# Patient Record
Sex: Male | Born: 1985 | Race: White | Hispanic: No | Marital: Single | Smoking: Current every day smoker
Health system: Southern US, Community
[De-identification: ages and names within clinical notes are randomized; demographics above are authoritative.]

## PROBLEM LIST (undated history)

## (undated) DIAGNOSIS — F419 Anxiety disorder, unspecified: Secondary | ICD-10-CM

## (undated) DIAGNOSIS — J189 Pneumonia, unspecified organism: Secondary | ICD-10-CM

## (undated) DIAGNOSIS — K219 Gastro-esophageal reflux disease without esophagitis: Secondary | ICD-10-CM

## (undated) HISTORY — PX: NO PAST SURGERIES: SHX2092

---

## 2008-07-07 ENCOUNTER — Emergency Department: Payer: Self-pay | Admitting: Emergency Medicine

## 2009-06-06 ENCOUNTER — Emergency Department: Payer: Self-pay | Admitting: Emergency Medicine

## 2010-01-13 ENCOUNTER — Emergency Department: Payer: Self-pay | Admitting: Internal Medicine

## 2010-11-04 ENCOUNTER — Emergency Department: Payer: Self-pay | Admitting: Emergency Medicine

## 2010-12-25 ENCOUNTER — Emergency Department: Payer: Self-pay | Admitting: Unknown Physician Specialty

## 2012-12-30 ENCOUNTER — Emergency Department: Payer: Self-pay | Admitting: Emergency Medicine

## 2012-12-30 LAB — COMPREHENSIVE METABOLIC PANEL
Albumin: 4.6 g/dL (ref 3.4–5.0)
Alkaline Phosphatase: 119 U/L (ref 50–136)
Anion Gap: 12 (ref 7–16)
BUN: 7 mg/dL (ref 7–18)
Bilirubin,Total: 0.3 mg/dL (ref 0.2–1.0)
Calcium, Total: 8.8 mg/dL (ref 8.5–10.1)
Chloride: 107 mmol/L (ref 98–107)
Co2: 25 mmol/L (ref 21–32)
Creatinine: 0.84 mg/dL (ref 0.60–1.30)
EGFR (African American): >60
EGFR (Non-African Amer.): >60
Osmolality: 285 (ref 275–301)
Potassium: 4.5 mmol/L (ref 3.5–5.1)
SGOT(AST): 47 U/L — ABNORMAL HIGH (ref 15–37)
SGPT (ALT): 64 U/L (ref 12–78)
Sodium: 144 mmol/L (ref 136–145)
Total Protein: 8.4 g/dL — ABNORMAL HIGH (ref 6.4–8.2)

## 2012-12-30 LAB — CBC
HCT: 48.4 % (ref 40.0–52.0)
MCH: 31.9 pg (ref 26.0–34.0)
MCHC: 34.5 g/dL (ref 32.0–36.0)
MCV: 93 fL (ref 80–100)
RBC: 5.23 10*6/uL (ref 4.40–5.90)
RDW: 13.5 % (ref 11.5–14.5)
WBC: 9.6 10*3/uL (ref 3.8–10.6)

## 2012-12-30 LAB — DRUG SCREEN, URINE
Amphetamines, Ur Screen: NEGATIVE (ref ?–1000)
Benzodiazepine, Ur Scrn: NEGATIVE (ref ?–200)
MDMA (Ecstasy)Ur Screen: NEGATIVE (ref ?–500)
Methadone, Ur Screen: NEGATIVE (ref ?–300)
Opiate, Ur Screen: NEGATIVE (ref ?–300)
Tricyclic, Ur Screen: NEGATIVE (ref ?–1000)

## 2012-12-30 LAB — URINALYSIS, COMPLETE
Bacteria: NONE SEEN
Glucose,UR: NEGATIVE mg/dL (ref 0–75)
Ketone: NEGATIVE
Leukocyte Esterase: NEGATIVE
Nitrite: NEGATIVE
Ph: 6 (ref 4.5–8.0)
Protein: NEGATIVE
RBC,UR: NONE SEEN /HPF (ref 0–5)
Specific Gravity: 1.006 (ref 1.003–1.030)
WBC UR: NONE SEEN /HPF (ref 0–5)

## 2012-12-30 LAB — TSH: Thyroid Stimulating Horm: 2.12 u[IU]/mL

## 2012-12-30 LAB — ETHANOL
Ethanol %: 0.244 % — ABNORMAL HIGH (ref 0.000–0.080)
Ethanol: 244 mg/dL

## 2013-05-08 ENCOUNTER — Emergency Department: Payer: Self-pay | Admitting: Emergency Medicine

## 2014-03-12 ENCOUNTER — Emergency Department: Payer: Self-pay | Admitting: Emergency Medicine

## 2014-03-12 LAB — BASIC METABOLIC PANEL WITH GFR
Anion Gap: 4 — ABNORMAL LOW
BUN: 15 mg/dL
Calcium, Total: 8.8 mg/dL
Chloride: 104 mmol/L
Co2: 28 mmol/L
Creatinine: 0.82 mg/dL
EGFR (African American): >60
EGFR (Non-African Amer.): >60
Glucose: 94 mg/dL
Osmolality: 273
Potassium: 3.9 mmol/L
Sodium: 136 mmol/L

## 2014-03-12 LAB — CBC
HCT: 43.9 % (ref 40.0–52.0)
HGB: 14.6 g/dL (ref 13.0–18.0)
MCH: 30.3 pg (ref 26.0–34.0)
MCHC: 33.3 g/dL (ref 32.0–36.0)
MCV: 91 fL (ref 80–100)
Platelet: 234 10*3/uL (ref 150–440)
RBC: 4.83 10*6/uL (ref 4.40–5.90)
RDW: 13.6 % (ref 11.5–14.5)
WBC: 7.3 10*3/uL (ref 3.8–10.6)

## 2014-03-12 LAB — TROPONIN I: Troponin-I: <0.02 ng/mL

## 2014-07-18 ENCOUNTER — Emergency Department: Payer: Self-pay | Admitting: Emergency Medicine

## 2014-07-18 LAB — CBC
HCT: 45.6 % (ref 40.0–52.0)
HGB: 15.4 g/dL (ref 13.0–18.0)
MCH: 32 pg (ref 26.0–34.0)
MCHC: 33.7 g/dL (ref 32.0–36.0)
MCV: 95 fL (ref 80–100)
Platelet: 232 10*3/uL (ref 150–440)
RBC: 4.81 10*6/uL (ref 4.40–5.90)
RDW: 13 % (ref 11.5–14.5)
WBC: 7.3 10*3/uL (ref 3.8–10.6)

## 2014-07-18 LAB — BASIC METABOLIC PANEL
Anion Gap: 11 (ref 7–16)
BUN: 10 mg/dL (ref 7–18)
Calcium, Total: 8.7 mg/dL (ref 8.5–10.1)
Chloride: 109 mmol/L — ABNORMAL HIGH (ref 98–107)
Co2: 23 mmol/L (ref 21–32)
Creatinine: 0.87 mg/dL (ref 0.60–1.30)
EGFR (Non-African Amer.): >60
Glucose: 91 mg/dL (ref 65–99)
Osmolality: 284 (ref 275–301)
POTASSIUM: 4.2 mmol/L (ref 3.5–5.1)
Sodium: 143 mmol/L (ref 136–145)

## 2014-07-18 LAB — TROPONIN I

## 2015-03-25 NOTE — Consult Note (Signed)
Brief Consult Note: Diagnosis: Alcohol Dependence, Mood Do NOS.   Patient was seen by consultant.   Consult note dictated.   Orders entered.   Comments: Pt seen in ED.  Will d/c IVC Pt will be given Lorazepam 0.5 mg po BID Start Thiamine 100mg  po Qdaily.  Obtain Collateral Info.  He is not motivated to go to Rehab  Discussed with [facility] Discharge Planner and he will obtain collateral info and will decide about disposition.  Electronic Signatures: Rhunette CroftFaheem, Rossetta Kama S (MD)  (Signed 28-Jan-14 11:41)  Authored: Brief Consult Note   Last Updated: 28-Jan-14 11:41 by Rhunette CroftFaheem, Abdulaziz Toman S (MD)

## 2015-03-25 NOTE — Consult Note (Signed)
PATIENT NAME:  Evan Novak, Evan Novak MR#:  [id number] DATE OF BIRTH:  1986/03/27  DATE OF CONSULTATION:  12/30/2012  REFERRING PHYSICIAN:  Suella BroadLinda Taylor, MD CONSULTING PHYSICIAN:  Ardeen FillersUzma S. Garnetta BuddyFaheem, MD  REASON FOR CONSULTATION:  Alcohol intoxication, suicidal ideation and IVC.   HISTORY OF PRESENT ILLNESS: The patient is a 29 year old single white male who presented to the Emergency Room because his throat was closing up. He presented by the EMS for complaining of throat closing up and was unable to breathe. His girlfriend called 911. The patient has history of mood swings and stated that 1 minute he is happy and another minute he is upset. The patient also mentioned that he was drinking 2 lacquers and then he started feeling that his throat was closing up. He reported that he was using the same brand. However, he reported that he does not drink on a daily basis and is only a social drinker. He reported that he has been living with his partner for the past 4 months. He stated that he enjoys drinking lacquers because it tastes good. He stated that he used them on a social basis. The patient reported that when he came to the hospital he was having some suicidal thoughts and was having a panic attack because his throat started closing up. He was IVCd by the Emergency Room physician. He was also exhibiting some  agitation and he was given Geodon IM to control his agitation at that time.   During my interview, patient appeared much calmer and relaxed as he was given the medication. He reported that he does not have any thoughts to harm himself. He reported that he has history of 3 DUIs in the past but he is currently on probation and has no pending charges. He stated that he lives with the same girl for the past 4 months and does not want to go to any rehabilitation program. 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 The patient reported that he does not use any other drugs. He denies having any thoughts to harm  himself. He denied having any perceptual disturbances.   PAST PSYCHIATRIC HISTORY: The patient reported that he has never seen a psychiatrist in the past and does not use any psychotropic medication. Reported that he sleeps well.  He has fair relationship with everybody.   FAMILY HISTORY: The patient reported that his parents are also social drinker. His mother is also a diabetic.   MEDICAL HISTORY: The patient denies medical history.   CURRENT MEDICATIONS: The patient reported that he was given antibiotics in the past.   SOCIAL HISTORY: The patient reported that he does not want to get married because, "The woman will get on his nerves." He reported that he has a 27-year-old son but he is not seeing him on a regular basis because he stays with his mother. He currently lives with his girlfriend. 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 He moved from [place] to [place] recently. His family lives close by.   ALLERGIES: PENICILLIN.   CURRENT MEDICATIONS: None.   ANCILLARY DATA:  Temperature 97.7, pulse 93, respirations 20, blood pressure 132/70.   Glucose 105, BUN 7, creatinine 0.84, sodium 144, potassium 4.5, chloride 107, bicarbonate 25, anion gap 12, osmolality 285. Blood alcohol level 244. Protein 8.4, albumin 4.6, bilirubin 0.3, AST 47, ALT 54. TSH 2.12. Urine drug screen was negative. WBC 9.6, RBC 5.23, hemoglobin 15.7, hematocrit 48.4, MCV is 93, MCH is  31.9. UA was negative.   REVIEW OF SYSTEMS:  CONSTITUTIONAL: No fever or chills. No weight changes.  EYES: No double or blurred vision.  RESPIRATORY: No shortness of breath or cough.  CARDIOVASCULAR: No chest pain or orthopnea.  GASTROINTESTINAL: No abdominal pain nausea, vomiting, diarrhea.  GENITOURINARY: No incontinence or frequency.  ENDOCRINE: No heat or cold intolerance.  LYMPHATIC: No anemia or easy bruising.  INTEGUMENTARY: No acne or rash.  MUSCULOSKELETAL: No muscle or joint pain.  NEUROLOGICAL:  No tingling or weakness.   MENTAL STATUS EXAM: The patient is a thinly-built male who was sitting in the bed. He appeared calm and cooperative. His eye contact was good. His mood was fine. Affect was congruent. Thought process was logical and goal-directed. Thought content was nondelusional. He currently denied having any suicidal or homicidal ideations or plans. He demonstrated fair insight and judgment.   DIAGNOSTIC IMPRESSION:  AXIS I:  1.  Alcohol dependence.  2.  Mood disorder, not otherwise specified.   AXIS II: None.  AXIS III: Denied.   CURRENT TREATMENT PLAN:  1.  The patient will be released from the involuntary commitment at this time.  2.  The collateral information was obtained from his girlfriend, who reported that he is only using alcohol on a social basis and he does not have any significant problems with the same. He was given information about the RTS and he demonstrated understanding. He reported that he does not want to take any medications for depression or anxiety at this time. He will be discharged in a stable condition and his girlfriend is very supportive. The patient denied having any suicidal or homicidal ideations or plans. No new medications will be started at this time. The patient does not appear to be at risk to harm himself or others.   Thank you for allowing me to participate in the care of this patient.      ____________________________ Ardeen Fillers. Garnetta Buddy, MD usf:cs D: 12/30/2012 13:57:00 ET T: 12/30/2012 14:12:02 ET JOB#: [id number]  cc: Ardeen Fillers. Garnetta Buddy, MD, <Dictator> Rhunette Croft MD ELECTRONICALLY SIGNED 01/01/2013 11:12

## 2016-08-10 ENCOUNTER — Emergency Department
Admission: EM | Admit: 2016-08-10 | Discharge: 2016-08-10 | Disposition: A | Discharge status: Home / Self Care | Payer: Self-pay | Attending: Emergency Medicine | Admitting: Emergency Medicine

## 2016-08-10 ENCOUNTER — Emergency Department: Payer: Self-pay

## 2016-08-10 DIAGNOSIS — F10120 Alcohol abuse with intoxication, uncomplicated: Secondary | ICD-10-CM | POA: Insufficient documentation

## 2016-08-10 DIAGNOSIS — Z5181 Encounter for therapeutic drug level monitoring: Secondary | ICD-10-CM | POA: Insufficient documentation

## 2016-08-10 DIAGNOSIS — F10920 Alcohol use, unspecified with intoxication, uncomplicated: Secondary | ICD-10-CM

## 2016-08-10 LAB — CBC WITH DIFFERENTIAL/PLATELET
BASOS PCT: 1 %
Basophils Absolute: 0.1 10*3/uL (ref 0–0.1)
Eosinophils Absolute: 0.4 10*3/uL (ref 0–0.7)
Eosinophils Relative: 5 %
HEMATOCRIT: 47.3 % (ref 40.0–52.0)
Hemoglobin: 16.4 g/dL (ref 13.0–18.0)
LYMPHS ABS: 2.8 10*3/uL (ref 1.0–3.6)
Lymphocytes Relative: 36 %
MCH: 32 pg (ref 26.0–34.0)
MCHC: 34.6 g/dL (ref 32.0–36.0)
MCV: 92.6 fL (ref 80.0–100.0)
MONO ABS: 0.5 10*3/uL (ref 0.2–1.0)
MONOS PCT: 7 %
NEUTROS ABS: 4.1 10*3/uL (ref 1.4–6.5)
Neutrophils Relative %: 51 %
Platelets: 234 10*3/uL (ref 150–440)
RBC: 5.11 MIL/uL (ref 4.40–5.90)
RDW: 13.4 % (ref 11.5–14.5)
WBC: 7.9 10*3/uL (ref 3.8–10.6)

## 2016-08-10 LAB — COMPREHENSIVE METABOLIC PANEL
ALBUMIN: 4.7 g/dL (ref 3.5–5.0)
ALK PHOS: 65 U/L (ref 38–126)
ALT: 15 U/L — AB (ref 17–63)
ANION GAP: 12 (ref 5–15)
AST: 19 U/L (ref 15–41)
BUN: 15 mg/dL (ref 6–20)
CALCIUM: 8.8 mg/dL — AB (ref 8.9–10.3)
CHLORIDE: 109 mmol/L (ref 101–111)
CO2: 22 mmol/L (ref 22–32)
Creatinine, Ser: 0.81 mg/dL (ref 0.61–1.24)
GFR calc Af Amer: >60 mL/min (ref >60)
GFR calc non Af Amer: >60 mL/min (ref >60)
GLUCOSE: 86 mg/dL (ref 65–99)
Potassium: 4.1 mmol/L (ref 3.5–5.1)
SODIUM: 143 mmol/L (ref 135–145)
Total Bilirubin: 0.4 mg/dL (ref 0.3–1.2)
Total Protein: 7.8 g/dL (ref 6.5–8.1)

## 2016-08-10 LAB — URINE DRUG SCREEN, QUALITATIVE (ARMC ONLY)
Amphetamines, Ur Screen: NOT DETECTED
BARBITURATES, UR SCREEN: NOT DETECTED
Benzodiazepine, Ur Scrn: NOT DETECTED
COCAINE METABOLITE, UR ~~LOC~~: NOT DETECTED
Cannabinoid 50 Ng, Ur ~~LOC~~: NOT DETECTED
MDMA (ECSTASY) UR SCREEN: NOT DETECTED
Methadone Scn, Ur: NOT DETECTED
OPIATE, UR SCREEN: NOT DETECTED
PHENCYCLIDINE (PCP) UR S: NOT DETECTED
TRICYCLIC, UR SCREEN: NOT DETECTED

## 2016-08-10 LAB — ETHANOL: Alcohol, Ethyl (B): 287 mg/dL — ABNORMAL HIGH (ref <5)

## 2016-08-10 NOTE — ED Provider Notes (Signed)
[facility] Emergency Department Provider Note   ____________________________________________    I have reviewed the triage vital signs and the nursing notes.   HISTORY  Chief Complaint Altered Mental Status  History significantly limited, patient is not asked her questions   HPI Evan Novak is a 30 y.o. male who presents withaltered mental status. EMS reports the patient pulled his scooter over and lie down on the side of the road and apparently fell asleep. He is difficult to arouse and does not answer questions. No obvious injuries noted  No past medical history on file.  There are no active problems to display for this patient.   No past surgical history on file.  Prior to Admission medications   Not on File     Allergies Review of patient's allergies indicates not on file.  No family history on file.  Social History Social History  Substance Use Topics  . Smoking status: Not on file  . Smokeless tobacco: Not on file  . Alcohol use Not on file    Level V caveat: Unable to obtain Review of Systems due to altered mental status   ____________________________________________   PHYSICAL EXAM:  VITAL SIGNS: ED Triage Vitals  Enc Vitals Group     BP 08/10/16 1101 109/71     Pulse Rate 08/10/16 1101 85     Resp 08/10/16 1101 16     Temp 08/10/16 1101 97.6 F (36.4 C)     Temp Source 08/10/16 1101 Oral     SpO2 08/10/16 1101 96 %     Weight 08/10/16 1102 165 lb (74.8 kg)     Height 08/10/16 1102 5\' 8"  (1.727 m)     Head Circumference --      Peak Flow --      Pain Score --      Pain Loc --      Pain Edu? --      Excl. in GC? --     Constitutional: Opens eyes only to sternal rub Eyes: Conjunctivae are normal. Nystagmus noted, pupils are reactive and normal Head: Atraumatic. Nose: No congestion/rhinnorhea. Mouth/Throat: Mucous membranes are moist.   Neck:  No vertebral bony abnormalities felt Cardiovascular:  Normal rate, regular rhythm. Grossly normal heart sounds.  Good peripheral circulation. Respiratory: Normal respiratory effort.  No retractions. Lungs CTAB. Gastrointestinal: Soft and nontender. No distention.  Genitourinary: deferred Musculoskeletal: No lower extremity tenderness nor edema.  Warm and well perfused  Skin:  Skin is warm, dry and intact. No rash noted. Psychiatric: Unable to examine  ____________________________________________   LABS (all labs ordered are listed, but only abnormal results are displayed)  Labs Reviewed  CBC WITH DIFFERENTIAL/PLATELET  COMPREHENSIVE METABOLIC PANEL  ETHANOL  URINE DRUG SCREEN, QUALITATIVE ([facility] ONLY)   ____________________________________________  EKG  None ____________________________________________  RADIOLOGYCT head unremarkable ____________________________________________   PROCEDURES  Procedure(s) performed: No    Critical Care performed: No ____________________________________________   INITIAL IMPRESSION / ASSESSMENT AND PLAN / ED COURSE  Pertinent labs & imaging results that were available during my care of the patient were reviewed by me and considered in my medical decision making (see chart for details).  Patient resents with altered mental status. I suspect substance abuse  Clinical Course  ----------------------------------------- 2:28 PM on 08/10/2016 -----------------------------------------  Patient is awake, alert and oriented. He admits to alcohol abuse this morning. No SI or HI. He has his mother here to take him home. Safe for discharge at this time  ____________________________________________   FINAL CLINICAL IMPRESSION(S) / ED DIAGNOSES  Final diagnoses:  Alcohol intoxication, uncomplicated (HCC)      NEW MEDICATIONS STARTED DURING THIS VISIT:  New Prescriptions   No medications on file     Note:  This document was prepared using Dragon voice recognition software and may  include unintentional dictation errors.    Jene Every, MD 08/10/16 [phone]

## 2016-08-10 NOTE — ED Triage Notes (Signed)
Pt arrives via ems, pt was picked up after pulling his scooter off the road and lying himself down, pt appears sleepy or intoxicated, pt will open eyes and respoded with loud name calling and gentle shaking, pt smiling at this RN, states he did "this morning what I do every morning", but states that he "can't tell ne that" no obvious signs of distress, pt denies injury, denies pain, dr Cyril Loosenkinner at bedside

## 2016-10-10 ENCOUNTER — Encounter: Payer: Self-pay | Admitting: Emergency Medicine

## 2016-10-10 ENCOUNTER — Emergency Department
Admission: EM | Admit: 2016-10-10 | Discharge: 2016-10-10 | Disposition: A | Discharge status: Home / Self Care | Payer: Self-pay | Attending: Emergency Medicine | Admitting: Emergency Medicine

## 2016-10-10 DIAGNOSIS — F17210 Nicotine dependence, cigarettes, uncomplicated: Secondary | ICD-10-CM | POA: Insufficient documentation

## 2016-10-10 DIAGNOSIS — Z046 Encounter for general psychiatric examination, requested by authority: Secondary | ICD-10-CM

## 2016-10-10 DIAGNOSIS — Z5181 Encounter for therapeutic drug level monitoring: Secondary | ICD-10-CM | POA: Insufficient documentation

## 2016-10-10 DIAGNOSIS — F1094 Alcohol use, unspecified with alcohol-induced mood disorder: Secondary | ICD-10-CM

## 2016-10-10 DIAGNOSIS — F10920 Alcohol use, unspecified with intoxication, uncomplicated: Secondary | ICD-10-CM

## 2016-10-10 DIAGNOSIS — F10120 Alcohol abuse with intoxication, uncomplicated: Secondary | ICD-10-CM | POA: Insufficient documentation

## 2016-10-10 DIAGNOSIS — R4689 Other symptoms and signs involving appearance and behavior: Secondary | ICD-10-CM

## 2016-10-10 DIAGNOSIS — F919 Conduct disorder, unspecified: Secondary | ICD-10-CM | POA: Insufficient documentation

## 2016-10-10 DIAGNOSIS — F1010 Alcohol abuse, uncomplicated: Secondary | ICD-10-CM

## 2016-10-10 LAB — COMPREHENSIVE METABOLIC PANEL
ALBUMIN: 4.9 g/dL (ref 3.5–5.0)
ALK PHOS: 64 U/L (ref 38–126)
ALT: 15 U/L — ABNORMAL LOW (ref 17–63)
ANION GAP: 11 (ref 5–15)
AST: 19 U/L (ref 15–41)
BUN: 10 mg/dL (ref 6–20)
CALCIUM: 9.2 mg/dL (ref 8.9–10.3)
CO2: 26 mmol/L (ref 22–32)
Chloride: 108 mmol/L (ref 101–111)
Creatinine, Ser: 0.93 mg/dL (ref 0.61–1.24)
GFR calc non Af Amer: >60 mL/min (ref >60)
GLUCOSE: 94 mg/dL (ref 65–99)
POTASSIUM: 3.7 mmol/L (ref 3.5–5.1)
SODIUM: 145 mmol/L (ref 135–145)
Total Bilirubin: 0.3 mg/dL (ref 0.3–1.2)
Total Protein: 8.1 g/dL (ref 6.5–8.1)

## 2016-10-10 LAB — CBC
HEMATOCRIT: 48.1 % (ref 40.0–52.0)
HEMOGLOBIN: 16.6 g/dL (ref 13.0–18.0)
MCH: 31.7 pg (ref 26.0–34.0)
MCHC: 34.5 g/dL (ref 32.0–36.0)
MCV: 92 fL (ref 80.0–100.0)
Platelets: 266 10*3/uL (ref 150–440)
RBC: 5.23 MIL/uL (ref 4.40–5.90)
RDW: 13.2 % (ref 11.5–14.5)
WBC: 8.9 10*3/uL (ref 3.8–10.6)

## 2016-10-10 LAB — URINE DRUG SCREEN, QUALITATIVE (ARMC ONLY)
AMPHETAMINES, UR SCREEN: NOT DETECTED
BARBITURATES, UR SCREEN: NOT DETECTED
BENZODIAZEPINE, UR SCRN: NOT DETECTED
COCAINE METABOLITE, UR ~~LOC~~: NOT DETECTED
Cannabinoid 50 Ng, Ur ~~LOC~~: NOT DETECTED
MDMA (Ecstasy)Ur Screen: NOT DETECTED
METHADONE SCREEN, URINE: NOT DETECTED
Opiate, Ur Screen: NOT DETECTED
Phencyclidine (PCP) Ur S: NOT DETECTED
TRICYCLIC, UR SCREEN: NOT DETECTED

## 2016-10-10 LAB — ACETAMINOPHEN LEVEL

## 2016-10-10 LAB — ETHANOL: Alcohol, Ethyl (B): 324 mg/dL (ref <5)

## 2016-10-10 LAB — SALICYLATE LEVEL

## 2016-10-10 NOTE — ED Notes (Signed)
Patient observed lying in bed with eyes closed  Even, unlabored respirations observed   NAD pt appears to be sleeping  I will continue to monitor along with every 15 minute visual observations and ongoing security monitoring    

## 2016-10-10 NOTE — ED Notes (Signed)
BEHAVIORAL HEALTH ROUNDING  Patient sleeping: Yes Patient alert and oriented: Sleeping Behavior appropriate: Yes. ; If no, describe:  Nutrition and fluids offered: No, sleeping  Toileting and hygiene offered: No, sleeping  Sitter present: q15 minute observations and security monitoring  Law enforcement present: Yes ODS 

## 2016-10-10 NOTE — ED Triage Notes (Signed)
Pt came in with [place] [employer] with IVC papers that were taken out by his mother. Pt went to his mother's house and started to "show out." Pt then took out a box cutter and threatened his mother with it and stated that he was going to kill himself. Pt then barricaded himself in the house. [place] [employer] then made entrance into the house while his mother went to take out papers. Pt arrived to ED in handcuffs, swearing at staff and uncooperative. Pt reports that he does not have any SI or HI at this time, but "you all are fucked up people."

## 2016-10-10 NOTE — BH Assessment (Signed)
Assessment Note  Evan Novak is an 30 y.o. male who presents to the ER via law enforcement, due aggressive behaviors while at his mother's house. According to ER nurse's notes, patient "was showing out" at his mother's home. He then took a "box cutter" and threatened to end his life.  Per the report of the patient, he does not remember the events that took place, prior to arriving to the ER. "All I know, I was drinking with my dad and I woke up here." He further states, he's been told, when he drinks he becomes another person. He becomes aggressive and "do stupid things." He acknowledges, he has a problem with alcohol and he doesn't need to drink.  His drinking increased the day he lost his job (10/01/2016). He lost his job due to a lack of transportation. Patient also states, he have a "bad attitude" and believes he have Bipolar. "I can be happy and then I just get mad and just don't want no body around me." He further shared, his previous job told him, he's in need of anger management classes.  He's currently separated from his wife and is in the final stages of divorce. His wife left because of his drinking and "bad attitude." They have a six-year-old son together and the mother/estranged wife does not allow him to see him.  Patient denies SI/HI and AV/H. He also reports of having no current involvement with the legal system. He lost his drivers licenses approximately two to three years ago, due to three DWI's. He chose to go to jail "and do my time instead of probation. I wanted to gone ahead and get it over with." Per his report, he was released from jail 10/15/2015. He started DWI (Substance Abuse) Classes as a means to get his licenses reinstated but he was unable to afford the classes.  He denies the use of any other mind-altering substances.  During the interview, the patient was calm, cooperative and polite. He was touching parts of his body and looking at them. While rubbing his face, he  stated, what the hell I do? I'm hurting. I'm sore."  Diagnosis: Alcohol Use Disorder; Severe  Past Medical History: History reviewed. No pertinent past medical history.  History reviewed. No pertinent surgical history.  Family History: History reviewed. No pertinent family history.  Social History:  reports that he has been smoking Cigarettes.  He has been smoking about 1.00 pack per day. He has never used smokeless tobacco. He reports that he drinks alcohol. He reports that he does not use drugs.  Additional Social History:  Alcohol / Drug Use Pain Medications: See PTA Prescriptions: See PTA Over the Counter: See PTA History of alcohol / drug use?: Yes Longest period of sobriety (when/how long): Unable to quantify Negative Consequences of Use: Financial, Personal relationships, Work / Programmer, multimediachool, Armed forces operational officerLegal Withdrawal Symptoms:  (Reports of none) Substance #1 Name of Substance 1: Alcohol 1 - Age of First Use: 23 1 - Amount (size/oz): "Hell, I drink alot (Unable to quantify)" 1 - Frequency: Daily 1 - Duration: Daily since he lsot his job, 10/01/2016 1 - Last Use / Amount: 10/09/2016  CIWA: CIWA-Ar BP: 115/70 Pulse Rate: 77 COWS:    Allergies:  Allergies  Allergen Reactions  . Penicillins Other (See Comments)    Unknown     Home Medications:  (Not in a hospital admission)  OB/GYN Status:  No LMP for male patient.  General Assessment Data [facility] TTS Assessment:  In system Is this a Tele or Face-to-Face Assessment?: Face-to-Face Is this an Initial Assessment or a Re-assessment for this encounter?: Initial Assessment Marital status: Separated Maiden name: n/a Is patient pregnant?: No Pregnancy Status: No Living Arrangements: Other (Comment) ("I stay with different people, house to house.") Can pt return to current living arrangement?: Yes Admission Status: Involuntary Is patient capable of signing voluntary admission?: No Referral Source:  Self/Family/Friend Insurance type: None  Medical Screening Exam [facility] Walk-in ONLY) Medical Exam completed: Yes  Crisis Care Plan Living Arrangements: Other (Comment) ("I stay with different people, house to house.") Legal Guardian: Other: (None) Name of Psychiatrist: Reports of none Name of Therapist: Reports of none.  Education Status Is patient currently in school?: No Current Grade: n/a Highest grade of school patient has completed: 6th Grade Name of school: 5th Grade Contact person: n/a  Risk to self with the past 6 months Suicidal Ideation: No Has patient been a risk to self within the past 6 months prior to admission? : No Suicidal Intent: No Has patient had any suicidal intent within the past 6 months prior to admission? : No Is patient at risk for suicide?: Yes (When intoxicated) Suicidal Plan?: No Has patient had any suicidal plan within the past 6 months prior to admission? : No Access to Means: No What has been your use of drugs/alcohol within the last 12 months?: Alcohol Previous Attempts/Gestures: No How many times?: 0 Other Self Harm Risks: Active Addiction Triggers for Past Attempts: None known Intentional Self Injurious Behavior: None Family Suicide History: No Recent stressful life event(s): Other (Comment) (Lost of job) Persecutory voices/beliefs?: No Depression: Yes Depression Symptoms: Feeling angry/irritable, Isolating, Feeling worthless/self pity, Guilt Substance abuse history and/or treatment for substance abuse?: Yes Suicide prevention information given to non-admitted patients: Not applicable  Risk to Others within the past 6 months Homicidal Ideation: No Does patient have any lifetime risk of violence toward others beyond the six months prior to admission? : No Thoughts of Harm to Others: No Current Homicidal Intent: No Current Homicidal Plan: No Access to Homicidal Means: No Identified Victim: Reports of none History of harm to others?:  No Assessment of Violence: None Noted Violent Behavior Description: Reports of none Does patient have access to weapons?: No Criminal Charges Pending?: No Does patient have a court date: No Is patient on probation?: No  Psychosis Hallucinations: None noted Delusions: None noted  Mental Status Report Appearance/Hygiene: In scrubs, Unremarkable Eye Contact: Good Motor Activity: Freedom of movement, Unremarkable Speech: Logical/coherent, Unremarkable Level of Consciousness: Alert Mood: Anxious, Pleasant, Helpless (Helpless due to his addiction.) Affect: Appropriate to circumstance, Depressed, Sad Anxiety Level: Minimal Thought Processes: Coherent, Relevant Judgement: Unimpaired Orientation: Person, Place, Situation, Time, Appropriate for developmental age Obsessive Compulsive Thoughts/Behaviors: Minimal  Cognitive Functioning Concentration: Normal Memory: Recent Intact, Remote Intact IQ: Average Insight: Fair Impulse Control: Fair Appetite: Good Weight Loss: 0 Weight Gain: 0 Sleep: No Change Total Hours of Sleep: 8 Vegetative Symptoms: None  ADLScreening [facility] Assessment Services) Patient's cognitive ability adequate to safely complete daily activities?: Yes Patient able to express need for assistance with ADLs?: Yes Independently performs ADLs?: Yes (appropriate for developmental age)  Prior Inpatient Therapy Prior Inpatient Therapy: No Prior Therapy Dates: Reports of none Prior Therapy Facilty/Provider(s): Reports of none Reason for Treatment: Reports of none  Prior Outpatient Therapy Prior Outpatient Therapy: Yes Prior Therapy Dates: 2015 Prior Therapy Facilty/Provider(s): [facility] Reason for Treatment: DWI Classes Does patient have an ACCT team?: No Does patient have  Intensive In-House Services?  : No Does patient have [facility] services? : No Does patient have P4CC services?: No  ADL Screening (condition at time of admission) Patient's cognitive  ability adequate to safely complete daily activities?: Yes Is the patient deaf or have difficulty hearing?: No Does the patient have difficulty seeing, even when wearing glasses/contacts?: No Does the patient have difficulty concentrating, remembering, or making decisions?: No Patient able to express need for assistance with ADLs?: Yes Does the patient have difficulty dressing or bathing?: No Independently performs ADLs?: Yes (appropriate for developmental age) Does the patient have difficulty walking or climbing stairs?: No Weakness of Legs: None Weakness of Arms/Hands: None  Home Assistive Devices/Equipment Home Assistive Devices/Equipment: None  Therapy Consults (therapy consults require a physician order) PT Evaluation Needed: No OT Evalulation Needed: No SLP Evaluation Needed: No Abuse/Neglect Assessment (Assessment to be complete while patient is alone) Physical Abuse: Denies Verbal Abuse: Yes, past (Comment) Sexual Abuse: Denies Exploitation of patient/patient's resources: Denies Self-Neglect: Denies Values / Beliefs Cultural Requests During Hospitalization: None Spiritual Requests During Hospitalization: None Consults Spiritual Care Consult Needed: No Social Work Consult Needed: No Merchant navy officer (For Healthcare) Does patient have an advance directive?: No    Additional Information 1:1 In Past 12 Months?: No CIRT Risk: No Elopement Risk: No Does patient have medical clearance?: Yes  Child/Adolescent Assessment Running Away Risk: Denies (Patient is an adult)  Disposition:  Disposition Initial Assessment Completed for this Encounter: Yes Disposition of Patient: Other dispositions (ER MD ordered Psych Consult)  On Site Evaluation by:   Reviewed with Physician:    Lilyan Gilford MS, LCAS, LPC, NCC, CCSI Therapeutic Triage Specialist 10/10/2016 5:49 PM

## 2016-10-10 NOTE — ED Notes (Signed)
BEHAVIORAL HEALTH ROUNDING Patient sleeping: Yes.   Patient alert and oriented: eyes closed  Appears to be asleep Behavior appropriate: Yes.  ; If no, describe:  Nutrition and fluids offered: Yes  Toileting and hygiene offered: sleeping Sitter present: q 15 minute observations and security monitoring Law enforcement present: yes  ODS 

## 2016-10-10 NOTE — BH Assessment (Signed)
Writer made attempts to contact patient's mother Evan Novak(Mary [phone]) to collect collateral information but was unable to reach her.

## 2016-10-10 NOTE — ED Provider Notes (Signed)
-----------------------------------------   1:13 PM on 10/10/2016 -----------------------------------------  Patient has been seen and evaluated by psychiatry. They believe the patient is safe for discharge home. Alcohol level was significantly elevated however it has now been 11 hours since the alcohol level was drawn. Patient will be given follow-up information for RTS [facility].   Minna AntisKevin Jaelah Hauth, MD 10/10/16 1313

## 2016-10-10 NOTE — Discharge Instructions (Signed)
You have been seen in the emergency department for a  psychiatric concern. You have been evaluated both medically as well as psychiatrically. Please follow-up with your outpatient resources provided. Return to the emergency department for any worsening symptoms, or any thoughts of hurting yourself or anyone else so that we may attempt to help you. 

## 2016-10-10 NOTE — ED Provider Notes (Signed)
[facility] Emergency Department Provider Note   ____________________________________________   First MD Initiated Contact with Patient 10/10/16 0131     (approximate)  I have reviewed the triage vital signs and the nursing notes.   HISTORY  Chief Complaint Psychiatric Evaluation    HPI Evan Novak is a 30 y.o. male here under involuntary commitment. Patient reports that he was drinking quite a lot tonight. He reports he had "more than a few beers". When he became upset with his mother, but denies harming her. He made threats he was going to kill himself and reports he locked himself in his house. He denies harming himself, but reports that he is upset with life in general.  At the present time he denies wanting to harm himself or anyone else, but apparently made threats and required prolonged for splint.  Denies any ingestion or overdose. Denies headache. Denies any pain or injury.  History reviewed. No pertinent past medical history.  There are no active problems to display for this patient.   History reviewed. No pertinent surgical history.  Prior to Admission medications   Not on File    Allergies Penicillins  History reviewed. No pertinent family history.  Social History Social History  Substance Use Topics  . Smoking status: Current Every Day Smoker    Packs/day: 1.00    Types: Cigarettes  . Smokeless tobacco: Never Used  . Alcohol use Yes    Review of Systems Constitutional: No fever/chills. Denies being ill recently Eyes: No visual changes. ENT: No sore throat. Cardiovascular: Denies chest pain. Respiratory: Denies shortness of breath. Gastrointestinal: No abdominal pain.  No nausea, no vomiting.   Genitourinary: Negative for dysuria. Musculoskeletal: Negative for back pain. Denies pain in his hands or arms (? Was punching at walls reportedly) Skin: Negative for rash. Neurological: Negative for headaches, focal  weakness or numbness.  10-point ROS otherwise negative.  ____________________________________________   PHYSICAL EXAM:  VITAL SIGNS: ED Triage Vitals [10/10/16 0059]  Enc Vitals Group     BP 120/78     Pulse Rate 86     Resp 18     Temp 98.4 F (36.9 C)     Temp Source Oral     SpO2 95 %     Weight 145 lb (65.8 kg)     Height 5\' 8"  (1.727 m)     Head Circumference      Peak Flow      Pain Score      Pain Loc      Pain Edu?      Excl. in GC?     Constitutional: Alert and oriented. Well appearing and in no acute distress.He is pleasant at this time though initially had to be directed by police.  Eyes: Conjunctivae are normal. PERRL. EOMI. Head: Atraumatic. Nose: No congestion/rhinnorhea. Mouth/Throat: Mucous membranes are moist.  Oropharynx non-erythematous. Neck: No stridor.   Cardiovascular: Normal rate, regular rhythm. Grossly normal heart sounds.  Good peripheral circulation. Respiratory: Normal respiratory effort.  No retractions. Lungs CTAB. Gastrointestinal: Soft and nontender. No distention. Musculoskeletal: No lower extremity tenderness nor edema.  No evidence of injury to the forearms or hands noted. Neurologic:  Slightly slurred speech and language. No gross focal neurologic deficits are appreciated.  Skin:  Skin is warm, dry and intact. No rash noted. Psychiatric: Mood and affect are slightly agitated. Presently denying suicidal ideation.  ____________________________________________   LABS (all labs ordered are listed, but only abnormal results are displayed)  Labs Reviewed  COMPREHENSIVE METABOLIC PANEL - Abnormal; Notable for the following:       Result Value   ALT 15 (*)    All other components within normal limits  ETHANOL - Abnormal; Notable for the following:    Alcohol, Ethyl (B) 324 (*)    All other components within normal limits  ACETAMINOPHEN LEVEL - Abnormal; Notable for the following:    Acetaminophen (Tylenol), Serum <10 (*)    All  other components within normal limits  SALICYLATE LEVEL  CBC  URINE DRUG SCREEN, QUALITATIVE ([facility] ONLY)   ____________________________________________  EKG   ____________________________________________  RADIOLOGY   ____________________________________________   PROCEDURES  Procedure(s) performed: None  Procedures  Critical Care performed: No  ____________________________________________   INITIAL IMPRESSION / ASSESSMENT AND PLAN / ED COURSE  Pertinent labs & imaging results that were available during my care of the patient were reviewed by me and considered in my medical decision making (see chart for details).  Involuntary commitment. Labs reassuring. Patient will be calm by verbal cues. Patient self admits to heavy drinking. Alcohol level significantly elevated, consistent with intoxication. Patient does tell me that he is not a daily drinker and that only from time to time he will have several drinks. Likely alcohol intoxication leading to tonight's episode, presently stable. Will allow patient to sober, psychiatry consult ordered. No evidence of acute injury noted. ----------------------------------------- 8:26 AM on 10/10/2016 -----------------------------------------   Ongoing care assigned to Dr. Lenard LancePaduchowski  Clinical Course      ____________________________________________   FINAL CLINICAL IMPRESSION(S) / ED DIAGNOSES  Final diagnoses:  Aggressive behavior  Alcoholic intoxication without complication (HCC)      NEW MEDICATIONS STARTED DURING THIS VISIT:  New Prescriptions   No medications on file     Note:  This document was prepared using Dragon voice recognition software and may include unintentional dictation errors.     Sharyn CreamerMark Quale, MD 10/10/16 [phone]

## 2016-10-10 NOTE — ED Notes (Signed)

## 2016-10-10 NOTE — ED Notes (Signed)
IVC  PAPERS  RESCINDED  PER  DR  CLAPACS  INFORMED  RN  AMY

## 2016-10-10 NOTE — ED Notes (Signed)
Pt provided with sandwich tray and drink.  

## 2016-10-10 NOTE — ED Notes (Signed)
BEHAVIORAL HEALTH ROUNDING Patient sleeping: No. Patient alert and oriented: yes Behavior appropriate: Yes.  ; If no, describe:  Nutrition and fluids offered: yes Toileting and hygiene offered: Yes  Sitter present: q15 minute observations and security  monitoring Law enforcement present: Yes  ODS  

## 2016-10-10 NOTE — ED Notes (Signed)
Calvin from behavior in room with patient

## 2016-10-10 NOTE — Consult Note (Signed)
[place] Psychiatry Consult   Reason for Consult:  Consult for 30 year old man with a history of alcohol abuse brought into the hospital under IVC after threatening his family while drunk Referring Physician:  Dahlia Client Patient Identification: Evan Novak MRN:  [id number] Principal Diagnosis: Alcohol-induced mood disorder [facility]) Diagnosis:   Patient Active Problem List   Diagnosis Date Noted  . Alcohol-induced mood disorder ([place]) [F10.94] 10/10/2016  . Alcohol abuse [F10.10] 10/10/2016  . Involuntary commitment [Z04.6] 10/10/2016    Total Time spent with patient: 1 hour  Subjective:   Evan Novak is a 30 y.o. male patient admitted with "I guess I was intoxicated".  HPI:  Patient interviewed. Chart reviewed. Labs and vitals reviewed. Case reviewed with emergency room doctor. 30 year old man came into the emergency room last night on IVC papers taken out by his family. Apparently he came to his mother's house drunk and started a fight that then escalated to him threatening to kill or hurt family members or to kill himself. Was difficult to manage by the police as well. On interview today the patient says he doesn't remember any of that. He remembers starting to drink last night and the next thing he remembers were police grabbing him. He has no idea how much she had to drink last night but thinks it was a lot. Denies that he was using other drugs. He says that he only drinks about every 1-2 weeks but when he does eat usually results in binging to the point of a blackout. When he is not intoxicated he says his mood is fine. He is not feeling depressed angry or irritable. Usually sleeps reasonably well. Appetite normal. No physical problems. Denies any hallucinations either intoxicated or sober. Never seen a psychiatrist never been on psychiatric medicine never been in a psychiatric hospital. He says he has made efforts to stop drinking in the past. Longest sobriety outside of  incarceration was about 2 months. He says that he knows that it's bad for him because he does these kind of things.  Medical history: Denies any history of withdrawal seizures or other medical problems. Not on any medication.  Substance abuse history: Long history of abuse of primarily alcohol. Denies that he abuses any other drugs. Longest sobriety and only experience with treatment was in prison. No history of DTs or seizures.  Social history: Patient lives with his father. Works doing Architect. Just recently decided he was going to move here to [facility] because his father drinks too much during the day.    Past Psychiatric History: Patient has never seen a psychiatrist or been in a psychiatric hospital. Denies any history of suicide attempts. Only substance abuse treatment was in prison.  Risk to Self: Is patient at risk for suicide?: No Risk to Others:   Prior Inpatient Therapy:   Prior Outpatient Therapy:    Past Medical History: History reviewed. No pertinent past medical history. History reviewed. No pertinent surgical history. Family History: History reviewed. No pertinent family history. Family Psychiatric  History: Says his father has an alcohol abuse problem as well. Social History:  History  Alcohol Use  . Yes     History  Drug Use No    Social History   Social History  . Marital status: Single    Spouse name: N/A  . Number of children: N/A  . Years of education: N/A   Social History Main Topics  . Smoking status: Current Every Day Smoker    Packs/day: 1.00  Types: Cigarettes  . Smokeless tobacco: Never Used  . Alcohol use Yes  . Drug use: No  . Sexual activity: Yes   Other Topics Concern  . None   Social History Narrative  . None   Additional Social History:    Allergies:   Allergies  Allergen Reactions  . Penicillins Other (See Comments)    Unknown     Labs:  Results for orders placed or performed during the hospital encounter of  10/10/16 (from the past 48 hour(s))  Comprehensive metabolic panel     Status: Abnormal   Collection Time: 10/10/16  1:05 AM  Result Value Ref Range   Sodium 145 135 - 145 mmol/L   Potassium 3.7 3.5 - 5.1 mmol/L   Chloride 108 101 - 111 mmol/L   CO2 26 22 - 32 mmol/L   Glucose, Bld 94 65 - 99 mg/dL   BUN 10 6 - 20 mg/dL   Creatinine, Ser 0.93 0.61 - 1.24 mg/dL   Calcium 9.2 8.9 - 10.3 mg/dL   Total Protein 8.1 6.5 - 8.1 g/dL   Albumin 4.9 3.5 - 5.0 g/dL   AST 19 15 - 41 U/L   ALT 15 (L) 17 - 63 U/L   Alkaline Phosphatase 64 38 - 126 U/L   Total Bilirubin 0.3 0.3 - 1.2 mg/dL   GFR calc non Af Amer >60 >60 mL/min   GFR calc Af Amer >60 >60 mL/min    Comment: (NOTE) The eGFR has been calculated using the CKD EPI equation. This calculation has not been validated in all clinical situations. eGFR's persistently <60 mL/min signify possible Chronic Kidney Disease.    Anion gap 11 5 - 15  Ethanol     Status: Abnormal   Collection Time: 10/10/16  1:05 AM  Result Value Ref Range   Alcohol, Ethyl (B) 324 (HH) <5 mg/dL    Comment: CRITICAL RESULT CALLED TO, READ BACK BY AND VERIFIED WITH SARAH OLEJAR ON 10/10/16 AT 0220 BY [facility]        LOWEST DETECTABLE LIMIT FOR SERUM ALCOHOL IS 5 mg/dL FOR MEDICAL PURPOSES ONLY   Salicylate level     Status: None   Collection Time: 10/10/16  1:05 AM  Result Value Ref Range   Salicylate Lvl <3.4 2.8 - 30.0 mg/dL  Acetaminophen level     Status: Abnormal   Collection Time: 10/10/16  1:05 AM  Result Value Ref Range   Acetaminophen (Tylenol), Serum <10 (L) 10 - 30 ug/mL    Comment:        THERAPEUTIC CONCENTRATIONS VARY SIGNIFICANTLY. A RANGE OF 10-30 ug/mL MAY BE AN EFFECTIVE CONCENTRATION FOR MANY PATIENTS. HOWEVER, SOME ARE BEST TREATED AT CONCENTRATIONS OUTSIDE THIS RANGE. ACETAMINOPHEN CONCENTRATIONS >150 ug/mL AT 4 HOURS AFTER INGESTION AND >50 ug/mL AT 12 HOURS AFTER INGESTION ARE OFTEN ASSOCIATED WITH TOXIC REACTIONS.   cbc      Status: None   Collection Time: 10/10/16  1:05 AM  Result Value Ref Range   WBC 8.9 3.8 - 10.6 K/uL   RBC 5.23 4.40 - 5.90 MIL/uL   Hemoglobin 16.6 13.0 - 18.0 g/dL   HCT 48.1 40.0 - 52.0 %   MCV 92.0 80.0 - 100.0 fL   MCH 31.7 26.0 - 34.0 pg   MCHC 34.5 32.0 - 36.0 g/dL   RDW 13.2 11.5 - 14.5 %   Platelets 266 150 - 440 K/uL  Urine Drug Screen, Qualitative     Status: None   Collection Time: 10/10/16  1:31 AM  Result Value Ref Range   Tricyclic, Ur Screen NONE DETECTED NONE DETECTED   Amphetamines, Ur Screen NONE DETECTED NONE DETECTED   MDMA (Ecstasy)Ur Screen NONE DETECTED NONE DETECTED   Cocaine Metabolite,Ur [place] NONE DETECTED NONE DETECTED   Opiate, Ur Screen NONE DETECTED NONE DETECTED   Phencyclidine (PCP) Ur S NONE DETECTED NONE DETECTED   Cannabinoid 50 Ng, Ur [place] NONE DETECTED NONE DETECTED   Barbiturates, Ur Screen NONE DETECTED NONE DETECTED   Benzodiazepine, Ur Scrn NONE DETECTED NONE DETECTED   Methadone Scn, Ur NONE DETECTED NONE DETECTED    Comment: (NOTE) 893  Tricyclics, urine               Cutoff 1000 ng/mL 200  Amphetamines, urine             Cutoff 1000 ng/mL 300  MDMA (Ecstasy), urine           Cutoff 500 ng/mL 400  Cocaine Metabolite, urine       Cutoff 300 ng/mL 500  Opiate, urine                   Cutoff 300 ng/mL 600  Phencyclidine (PCP), urine      Cutoff 25 ng/mL 700  Cannabinoid, urine              Cutoff 50 ng/mL 800  Barbiturates, urine             Cutoff 200 ng/mL 900  Benzodiazepine, urine           Cutoff 200 ng/mL 1000 Methadone, urine                Cutoff 300 ng/mL 1100 1200 The urine drug screen provides only a preliminary, unconfirmed 1300 analytical test result and should not be used for non-medical 1400 purposes. Clinical consideration and professional judgment should 1500 be applied to any positive drug screen result due to possible 1600 interfering substances. A more specific alternate chemical method 1700 must be used in order to  obtain a confirmed analytical result.  1800 Gas chromato graphy / mass spectrometry (GC/MS) is the preferred 1900 confirmatory method.     No current facility-administered medications for this encounter.    No current outpatient prescriptions on file.    Musculoskeletal: Strength & Muscle Tone: within normal limits Gait & Station: normal Patient leans: N/A  Psychiatric Specialty Exam: Physical Exam  Nursing note and vitals reviewed. Constitutional: He appears well-developed and well-nourished.  HENT:  Head: Normocephalic and atraumatic.  Eyes: Conjunctivae are normal. Pupils are equal, round, and reactive to light.  Neck: Normal range of motion.  Cardiovascular: Regular rhythm and normal heart sounds.   Respiratory: Effort normal. No respiratory distress.  GI: Soft.  Musculoskeletal: Normal range of motion.  Neurological: He is alert.  Skin: Skin is warm and dry.  Psychiatric: He has a normal mood and affect. His speech is normal and behavior is normal. Judgment and thought content normal. He exhibits abnormal recent memory.    Review of Systems  Constitutional: Negative.   HENT: Negative.   Eyes: Negative.   Respiratory: Negative.   Cardiovascular: Negative.   Gastrointestinal: Negative.   Musculoskeletal: Negative.   Skin: Negative.   Neurological: Negative.   Psychiatric/Behavioral: Positive for memory loss and substance abuse. Negative for depression, hallucinations and suicidal ideas. The patient is not nervous/anxious and does not have insomnia.     Blood pressure (!) 95/59, pulse 67, temperature 97.8 F (36.6 C),  temperature source Oral, resp. rate 18, height 5' 8"  (1.727 m), weight 65.8 kg (145 lb), SpO2 97 %.Body mass index is 22.05 kg/m.  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 He was very drunk when he came in last night. Had a blackout and got aggressive at home. Currently he does not meet commitment criteria. Does not require inpatient psychiatric treatment. He did some counseling with him about alcohol abuse and the dangers of continued drinking. Strongly encourage him to consider going to [place] and [place] meetings and getting involved in substance abuse treatment. Patient states an understanding of that. IVC discontinued. Case reviewed with ER and TTS. He can be discharged home.  Disposition: Patient does not meet criteria for psychiatric inpatient admission. Supportive therapy provided about ongoing stressors.  Alethia Berthold, MD 10/10/2016 1:23 PM

## 2016-10-10 NOTE — ED Notes (Signed)
Breakfast was given to patient. 

## 2016-11-23 ENCOUNTER — Encounter (HOSPITAL_COMMUNITY): Payer: Self-pay | Admitting: *Deleted

## 2016-11-23 NOTE — Progress Notes (Signed)
Spoke with pt for pre-op call. Pt denies cardiac history. 

## 2016-11-23 NOTE — H&P (Signed)
  Evan Novak is an 30 y.o. male.   Chief Complaint: LACERATION OF LEFT FOREARM INCLUDING MUSCLE, TENDON AND/OR FASCIA; LACERATION OF ULNAR ARTERY HPI: PATIENT WAS CUTTING PLASTIC AT WORK ON 10/30/16 AND THE KNIFE SLIPPED CAUSING A LACERATION OF HIS LEFT FOREARM. PATIENT SEEN IN THE OFFICE ON 11/09/16. PATIENT IS HERE TODAY FOR SURGERY.  Past Medical History:  Diagnosis Date  . Anxiety   . GERD (gastroesophageal reflux disease)   . Pneumonia     Past Surgical History:  Procedure Laterality Date  . NO PAST SURGERIES      Family History  Problem Relation Age of Onset  . Peripheral vascular disease Mother    Social History:  reports that he has been smoking Cigarettes.  He has been smoking about 1.00 pack per day. He has never used smokeless tobacco. He reports that he drinks alcohol. He reports that he does not use drugs.  Allergies:  Allergies  Allergen Reactions  . Penicillins Other (See Comments)    Childhood allergy  Has patient had a PCN reaction causing immediate rash, facial/tongue/throat swelling, SOB or lightheadedness with hypotension: Unknown Has patient had a PCN reaction causing severe rash involving mucus membranes or skin necrosis: Unknown Has patient had a PCN reaction that required hospitalization Unknown Has patient had a PCN reaction occurring within the last 10 years: No If all of the above answers are "NO", then may proceed with Cephalosporin use.     No prescriptions prior to admission.    No results found for this or any previous visit (from the past 48 hour(s)). No results found.  ROS NO RECENT ILLNESSES OR HOSPITALIZATIONS  There were no vitals taken for this visit. Physical Exam  General Appearance:  Alert, cooperative, no distress, appears stated age  Head:  Normocephalic, without obvious abnormality, atraumatic  Eyes:  Pupils equal, conjunctiva/corneas clear,         Throat: Lips, mucosa, and tongue normal; teeth and gums normal  Neck:  No visible masses     Lungs:   respirations unlabored  Chest Wall:  No tenderness or deformity  Heart:  Regular rate and rhythm,  Abdomen:   Soft, non-tender,         Extremities: BLOCK IN PLACE FINGERS WARM WELL PERFUSED SUTURES STILL IN PLACE  Pulses: 2+ and symmetric  Skin: Skin color, texture, turgor normal, no rashes or lesions     Neurologic: Normal    Assessment  LACERATION OF LEFT FOREARM INCLUDING MUSCLE, TENDON AND/OR FASCIA;   Plan LEFT FOREARM WOUND EXPLORATION AND REPAIR AS INDICATED, NERVE TENDON AS INDICATED  R/B/A DISCUSSED WITH PT IN OFFICE.  PT VOICED UNDERSTANDING OF PLAN CONSENT SIGNED DAY OF SURGERY PT SEEN AND EXAMINED PRIOR TO OPERATIVE PROCEDURE/DAY OF SURGERY SITE MARKED. QUESTIONS ANSWERED WILL GO HOME FOLLOWING SURGERY  WE ARE PLANNING SURGERY FOR YOUR UPPER EXTREMITY. THE RISKS AND BENEFITS OF SURGERY INCLUDE BUT NOT LIMITED TO BLEEDING INFECTION, DAMAGE TO NEARBY NERVES ARTERIES TENDONS, FAILURE OF SURGERY TO ACCOMPLISH ITS INTENDED GOALS, PERSISTENT SYMPTOMS AND NEED FOR FURTHER SURGICAL INTERVENTION. WITH THIS IN MIND WE WILL PROCEED. I HAVE DISCUSSED WITH THE PATIENT THE PRE AND POSTOPERATIVE REGIMEN AND THE DOS AND DON'TS. PT VOICED UNDERSTANDING AND INFORMED CONSENT SIGNED.  Karma GreaserSamantha Bonham Barton 11/23/2016, 12:04 PM

## 2016-11-27 ENCOUNTER — Encounter (HOSPITAL_COMMUNITY)
Admission: RE | Disposition: A | Discharge status: Home / Self Care | Payer: Self-pay | Source: Ambulatory Visit | Attending: Orthopedic Surgery

## 2016-11-27 ENCOUNTER — Ambulatory Visit (HOSPITAL_COMMUNITY): Payer: Worker's Compensation | Admitting: Anesthesiology

## 2016-11-27 ENCOUNTER — Ambulatory Visit (HOSPITAL_COMMUNITY)
Admission: RE | Admit: 2016-11-27 | Discharge: 2016-11-27 | Disposition: A | Discharge status: Home / Self Care | Payer: Worker's Compensation | Source: Ambulatory Visit | Attending: Orthopedic Surgery | Admitting: Orthopedic Surgery

## 2016-11-27 ENCOUNTER — Ambulatory Visit (HOSPITAL_COMMUNITY): Payer: Worker's Compensation

## 2016-11-27 ENCOUNTER — Encounter (HOSPITAL_COMMUNITY): Payer: Self-pay | Admitting: *Deleted

## 2016-11-27 DIAGNOSIS — Y990 Civilian activity done for income or pay: Secondary | ICD-10-CM | POA: Insufficient documentation

## 2016-11-27 DIAGNOSIS — F419 Anxiety disorder, unspecified: Secondary | ICD-10-CM | POA: Insufficient documentation

## 2016-11-27 DIAGNOSIS — Y9389 Activity, other specified: Secondary | ICD-10-CM | POA: Insufficient documentation

## 2016-11-27 DIAGNOSIS — F17210 Nicotine dependence, cigarettes, uncomplicated: Secondary | ICD-10-CM | POA: Diagnosis not present

## 2016-11-27 DIAGNOSIS — S0181XA Laceration without foreign body of other part of head, initial encounter: Secondary | ICD-10-CM | POA: Diagnosis not present

## 2016-11-27 DIAGNOSIS — W260XXA Contact with knife, initial encounter: Secondary | ICD-10-CM | POA: Insufficient documentation

## 2016-11-27 DIAGNOSIS — R079 Chest pain, unspecified: Secondary | ICD-10-CM

## 2016-11-27 DIAGNOSIS — Z880 Allergy status to penicillin: Secondary | ICD-10-CM | POA: Diagnosis not present

## 2016-11-27 DIAGNOSIS — K219 Gastro-esophageal reflux disease without esophagitis: Secondary | ICD-10-CM | POA: Insufficient documentation

## 2016-11-27 DIAGNOSIS — R0602 Shortness of breath: Secondary | ICD-10-CM

## 2016-11-27 DIAGNOSIS — Y9269 Other specified industrial and construction area as the place of occurrence of the external cause: Secondary | ICD-10-CM | POA: Diagnosis not present

## 2016-11-27 DIAGNOSIS — S6402XA Injury of ulnar nerve at wrist and hand level of left arm, initial encounter: Secondary | ICD-10-CM | POA: Diagnosis present

## 2016-11-27 DIAGNOSIS — S51812A Laceration without foreign body of left forearm, initial encounter: Secondary | ICD-10-CM

## 2016-11-27 HISTORY — DX: Gastro-esophageal reflux disease without esophagitis: K21.9

## 2016-11-27 HISTORY — PX: INCISION AND DRAINAGE WOUND WITH TENDON REPAIR: SHX5639

## 2016-11-27 HISTORY — DX: Pneumonia, unspecified organism: J18.9

## 2016-11-27 HISTORY — DX: Anxiety disorder, unspecified: F41.9

## 2016-11-27 SURGERY — INCISION AND DRAINAGE WOUND WITH TENDON REPAIR
Anesthesia: Monitor Anesthesia Care | Laterality: Left

## 2016-11-27 MED ORDER — HYDROCODONE-ACETAMINOPHEN 10-325 MG PO TABS: 1.0000 | ORAL_TABLET | Freq: Four times a day (QID) | ORAL | 0 refills | Status: AC | PRN

## 2016-11-27 MED ORDER — CHLORHEXIDINE GLUCONATE 4 % EX LIQD: 60.0000 mL | Freq: Once | CUTANEOUS | Status: DC

## 2016-11-27 MED ORDER — DOCUSATE SODIUM 100 MG PO CAPS: 100.0000 mg | ORAL_CAPSULE | Freq: Two times a day (BID) | ORAL | 0 refills | Status: AC

## 2016-11-27 MED ORDER — PROPOFOL 10 MG/ML IV BOLUS
INTRAVENOUS | Status: AC
  Filled 2016-11-27: qty 20

## 2016-11-27 MED ORDER — ONDANSETRON HCL 4 MG/2ML IJ SOLN
INTRAMUSCULAR | Status: DC | PRN
  Administered 2016-11-27: 4 mg via INTRAVENOUS

## 2016-11-27 MED ORDER — ONDANSETRON HCL 4 MG/2ML IJ SOLN
INTRAMUSCULAR | Status: AC
  Filled 2016-11-27: qty 2

## 2016-11-27 MED ORDER — MIDAZOLAM HCL 2 MG/2ML IJ SOLN
2.0000 mg | Freq: Once | INTRAMUSCULAR | Status: AC
  Administered 2016-11-27: 2 mg via INTRAVENOUS

## 2016-11-27 MED ORDER — FENTANYL CITRATE (PF) 100 MCG/2ML IJ SOLN
INTRAMUSCULAR | Status: AC
Start: 2016-11-27 — End: 2016-11-27
  Filled 2016-11-27: qty 2

## 2016-11-27 MED ORDER — LIDOCAINE HCL 1 % IJ SOLN
INTRAMUSCULAR | Status: DC | PRN
  Administered 2016-11-27: 7 mL

## 2016-11-27 MED ORDER — LACTATED RINGERS IV SOLN
INTRAVENOUS | Status: DC
  Administered 2016-11-27: 16:00:00 via INTRAVENOUS

## 2016-11-27 MED ORDER — FENTANYL CITRATE (PF) 100 MCG/2ML IJ SOLN
100.0000 ug | Freq: Once | INTRAMUSCULAR | Status: AC
  Administered 2016-11-27: 100 ug via INTRAVENOUS
  Filled 2016-11-27: qty 2

## 2016-11-27 MED ORDER — CLINDAMYCIN PHOSPHATE 900 MG/50ML IV SOLN
900.0000 mg | INTRAVENOUS | Status: AC
  Administered 2016-11-27: 900 mg via INTRAVENOUS

## 2016-11-27 MED ORDER — PROPOFOL 10 MG/ML IV BOLUS
INTRAVENOUS | Status: DC | PRN
  Administered 2016-11-27: 40 mg via INTRAVENOUS
  Administered 2016-11-27: 30 mg via INTRAVENOUS

## 2016-11-27 MED ORDER — CLINDAMYCIN PHOSPHATE 900 MG/50ML IV SOLN
INTRAVENOUS | Status: AC
  Filled 2016-11-27: qty 50

## 2016-11-27 MED ORDER — MIDAZOLAM HCL 5 MG/5ML IJ SOLN
INTRAMUSCULAR | Status: DC | PRN
  Administered 2016-11-27: 2 mg via INTRAVENOUS

## 2016-11-27 MED ORDER — MIDAZOLAM HCL 2 MG/2ML IJ SOLN
INTRAMUSCULAR | Status: AC
Start: 2016-11-27 — End: 2016-11-27
  Filled 2016-11-27: qty 2

## 2016-11-27 MED ORDER — MIDAZOLAM HCL 2 MG/2ML IJ SOLN
INTRAMUSCULAR | Status: AC
  Administered 2016-11-27: 2 mg via INTRAVENOUS
  Filled 2016-11-27: qty 2

## 2016-11-27 MED ORDER — VITAMIN C 500 MG PO TABS: 500.0000 mg | ORAL_TABLET | Freq: Every day | ORAL | 0 refills | Status: AC

## 2016-11-27 MED ORDER — PROPOFOL 500 MG/50ML IV EMUL
INTRAVENOUS | Status: DC | PRN
  Administered 2016-11-27: 100 ug/kg/min via INTRAVENOUS

## 2016-11-27 MED ORDER — FENTANYL CITRATE (PF) 100 MCG/2ML IJ SOLN
INTRAMUSCULAR | Status: DC | PRN
  Administered 2016-11-27: 100 ug via INTRAVENOUS

## 2016-11-27 MED ORDER — SODIUM CHLORIDE 0.9 % IR SOLN
Status: DC | PRN
  Administered 2016-11-27: 1000 mL

## 2016-11-27 MED ORDER — LIDOCAINE 2% (20 MG/ML) 5 ML SYRINGE
INTRAMUSCULAR | Status: AC
  Filled 2016-11-27: qty 5

## 2016-11-27 MED ORDER — LIDOCAINE HCL (PF) 1 % IJ SOLN
INTRAMUSCULAR | Status: AC
  Filled 2016-11-27: qty 30

## 2016-11-27 MED ORDER — FENTANYL CITRATE (PF) 100 MCG/2ML IJ SOLN
INTRAMUSCULAR | Status: AC
  Administered 2016-11-27: 100 ug via INTRAVENOUS
  Filled 2016-11-27: qty 2

## 2016-11-27 MED ORDER — LIDOCAINE HCL (CARDIAC) 20 MG/ML IV SOLN
INTRAVENOUS | Status: DC | PRN
  Administered 2016-11-27: 60 mg via INTRAVENOUS

## 2016-11-27 MED ORDER — LACTATED RINGERS IV SOLN
INTRAVENOUS | Status: DC | PRN
  Administered 2016-11-27: 18:00:00 via INTRAVENOUS

## 2016-11-27 SURGICAL SUPPLY — 56 items
BANDAGE ACE 4X5 VEL STRL LF (GAUZE/BANDAGES/DRESSINGS) ×3 IMPLANT
BANDAGE ELASTIC 3 VELCRO ST LF (GAUZE/BANDAGES/DRESSINGS) IMPLANT
BNDG COHESIVE 1X5 TAN STRL LF (GAUZE/BANDAGES/DRESSINGS) IMPLANT
BNDG CONFORM 2 STRL LF (GAUZE/BANDAGES/DRESSINGS) IMPLANT
BNDG ESMARK 4X9 LF (GAUZE/BANDAGES/DRESSINGS) ×3 IMPLANT
BNDG GAUZE ELAST 4 BULKY (GAUZE/BANDAGES/DRESSINGS) ×3 IMPLANT
CORDS BIPOLAR (ELECTRODE) ×3 IMPLANT
COVER SURGICAL LIGHT HANDLE (MISCELLANEOUS) ×3 IMPLANT
CUFF TOURNIQUET SINGLE 18IN (TOURNIQUET CUFF) ×3 IMPLANT
CUFF TOURNIQUET SINGLE 24IN (TOURNIQUET CUFF) IMPLANT
DRAIN PENROSE 1/4X12 LTX STRL (WOUND CARE) IMPLANT
DRAPE SURG 17X23 STRL (DRAPES) ×3 IMPLANT
DRSG ADAPTIC 3X8 NADH LF (GAUZE/BANDAGES/DRESSINGS) ×3 IMPLANT
DRSG EMULSION OIL 3X3 NADH (GAUZE/BANDAGES/DRESSINGS) ×3 IMPLANT
ELECT REM PT RETURN 9FT ADLT (ELECTROSURGICAL)
ELECTRODE REM PT RTRN 9FT ADLT (ELECTROSURGICAL) IMPLANT
GAUZE SPONGE 4X4 12PLY STRL (GAUZE/BANDAGES/DRESSINGS) ×3 IMPLANT
GAUZE XEROFORM 1X8 LF (GAUZE/BANDAGES/DRESSINGS) ×3 IMPLANT
GAUZE XEROFORM 5X9 LF (GAUZE/BANDAGES/DRESSINGS) IMPLANT
GLOVE BIOGEL PI IND STRL 8.5 (GLOVE) ×1 IMPLANT
GLOVE BIOGEL PI INDICATOR 8.5 (GLOVE) ×2
GLOVE SURG ORTHO 8.0 STRL STRW (GLOVE) ×3 IMPLANT
GOWN STRL REUS W/ TWL LRG LVL3 (GOWN DISPOSABLE) ×3 IMPLANT
GOWN STRL REUS W/ TWL XL LVL3 (GOWN DISPOSABLE) ×1 IMPLANT
GOWN STRL REUS W/TWL LRG LVL3 (GOWN DISPOSABLE) ×6
GOWN STRL REUS W/TWL XL LVL3 (GOWN DISPOSABLE) ×2
HANDPIECE INTERPULSE COAX TIP (DISPOSABLE)
KIT BASIN OR (CUSTOM PROCEDURE TRAY) ×3 IMPLANT
KIT ROOM TURNOVER OR (KITS) ×3 IMPLANT
MANIFOLD NEPTUNE II (INSTRUMENTS) ×3 IMPLANT
NEEDLE HYPO 25GX1X1/2 BEV (NEEDLE) ×3 IMPLANT
NS IRRIG 1000ML POUR BTL (IV SOLUTION) ×3 IMPLANT
PACK ORTHO EXTREMITY (CUSTOM PROCEDURE TRAY) ×3 IMPLANT
PAD ARMBOARD 7.5X6 YLW CONV (MISCELLANEOUS) IMPLANT
PAD CAST 4YDX4 CTTN HI CHSV (CAST SUPPLIES) ×1 IMPLANT
PADDING CAST COTTON 4X4 STRL (CAST SUPPLIES) ×2
SET HNDPC FAN SPRY TIP SCT (DISPOSABLE) IMPLANT
SOAP 2 % CHG 4 OZ (WOUND CARE) ×3 IMPLANT
SPONGE GAUZE 4X4 12PLY STER LF (GAUZE/BANDAGES/DRESSINGS) ×3 IMPLANT
SPONGE LAP 18X18 X RAY DECT (DISPOSABLE) IMPLANT
SPONGE LAP 4X18 X RAY DECT (DISPOSABLE) IMPLANT
SUCTION FRAZIER HANDLE 10FR (MISCELLANEOUS) ×2
SUCTION TUBE FRAZIER 10FR DISP (MISCELLANEOUS) ×1 IMPLANT
SUT ETHILON 4 0 PS 2 18 (SUTURE) IMPLANT
SUT ETHILON 5 0 P 3 18 (SUTURE)
SUT NYLON ETHILON 5-0 P-3 1X18 (SUTURE) IMPLANT
SUT PROLENE 4 0 PS 2 18 (SUTURE) ×3 IMPLANT
SYR CONTROL 10ML LL (SYRINGE) ×3 IMPLANT
TOWEL OR 17X24 6PK STRL BLUE (TOWEL DISPOSABLE) ×3 IMPLANT
TOWEL OR 17X26 10 PK STRL BLUE (TOWEL DISPOSABLE) ×3 IMPLANT
TUBE ANAEROBIC SPECIMEN COL (MISCELLANEOUS) IMPLANT
TUBE CONNECTING 12'X1/4 (SUCTIONS)
TUBE CONNECTING 12X1/4 (SUCTIONS) IMPLANT
UNDERPAD 30X30 (UNDERPADS AND DIAPERS) ×3 IMPLANT
WATER STERILE IRR 1000ML POUR (IV SOLUTION) ×3 IMPLANT
YANKAUER SUCT BULB TIP NO VENT (SUCTIONS) IMPLANT

## 2016-11-27 NOTE — Anesthesia Postprocedure Evaluation (Signed)
Anesthesia Post Note  Patient: Evan Novak  Procedure(s) Performed: Procedure(s) (LRB): LEFT FOREARM WOUND EXPLORATION AND NERVE DECOMPRESSION (Left)  Patient location during evaluation: PACU Anesthesia Type: MAC and Regional Level of consciousness: awake and alert Pain management: pain level controlled Vital Signs Assessment: post-procedure vital signs reviewed and stable Respiratory status: spontaneous breathing, nonlabored ventilation, respiratory function stable and patient connected to nasal cannula oxygen Cardiovascular status: stable and blood pressure returned to baseline Anesthetic complications: no       Last Vitals:  Vitals:   11/27/16 1845 11/27/16 1900  BP:  111/79  Pulse: (!) 56 (!) 55  Resp: 10 10  Temp:      Last Pain:  Vitals:   11/27/16 1524  TempSrc: Oral                 Nissa Stannard S

## 2016-11-27 NOTE — Anesthesia Procedure Notes (Addendum)
Anesthesia Regional Block:  Supraclavicular block  Pre-Anesthetic Checklist: ,, timeout performed, Correct Patient, Correct Site, Correct Laterality, Correct Procedure, Correct Position, site marked, Risks and benefits discussed,  Surgical consent,  Pre-op evaluation,  At surgeon's request and post-op pain management  Laterality: Left  Prep: chloraprep       Needles:  Injection technique: Single-shot  Needle Type: Echogenic Stimulator Needle     Needle Length: 9cm 9 cm Needle Gauge: 21 and 21 G    Additional Needles:  Procedures: ultrasound guided (picture in chart) and nerve stimulator Supraclavicular block  Nerve Stimulator or Paresthesia:  Response: 0.4 mA,   Additional Responses:   Narrative:  Start time: 11/27/2016 5:05 PM End time: 11/27/2016 5:15 PM Injection made incrementally with aspirations every 5 mL.  Performed by: Personally  Anesthesiologist: Arta BruceSSEY, Marieliz Strang  Additional Notes: Monitors applied. Patient sedated. Sterile prep and drape,hand hygiene and sterile gloves were used. Relevant anatomy identified.Needle position confirmed.Local anesthetic injected incrementally after negative aspiration. Local anesthetic spread visualized around nerve(s). Vascular puncture avoided. No complications. Image printed for medical record.The patient tolerated the procedure well.

## 2016-11-27 NOTE — Anesthesia Preprocedure Evaluation (Signed)
Anesthesia Evaluation  Patient identified by MRN, date of birth, ID band Patient awake    Reviewed: Allergy & Precautions, NPO status , Patient's Chart, lab work & pertinent test results  Airway Mallampati: I  TM Distance: >3 FB Neck ROM: Full    Dental   Pulmonary Current Smoker,    Pulmonary exam normal        Cardiovascular Normal cardiovascular exam     Neuro/Psych Anxiety    GI/Hepatic GERD  Medicated and Controlled,  Endo/Other    Renal/GU      Musculoskeletal   Abdominal   Peds  Hematology   Anesthesia Other Findings   Reproductive/Obstetrics                             Anesthesia Physical Anesthesia Plan  ASA: II  Anesthesia Plan: Regional and MAC   Post-op Pain Management:    Induction: Intravenous  Airway Management Planned: Simple Face Mask  Additional Equipment:   Intra-op Plan:   Post-operative Plan:   Informed Consent: I have reviewed the patients History and Physical, chart, labs and discussed the procedure including the risks, benefits and alternatives for the proposed anesthesia with the patient or authorized representative who has indicated his/her understanding and acceptance.     Plan Discussed with: CRNA and Surgeon  Anesthesia Plan Comments:         Anesthesia Quick Evaluation

## 2016-11-27 NOTE — Discharge Instructions (Signed)
[facility], Care After Introduction Refer to this sheet in the next few weeks. These instructions provide you with information about caring for yourself after your procedure. Your health care provider may also give you more specific instructions. Your treatment has been planned according to current medical practices, but problems sometimes occur. Call your health care provider if you have any problems or questions after your procedure. What can I expect after the procedure? After the procedure, it is common to have:  Sleepiness.  Nausea.  Itching. Follow these instructions at home:  Take over-the-counter and prescription medicines only as told by your health care provider.  Do not drive, exercise, or do any other activities that require coordination for 24 hours or as told by your health care provider. Ask your health care provider when you can return to your usual activities.  Drink enough water to keep your urine clear or pale yellow.  If you had a bandage (dressing) placed over the injection site, only remove it when told to do so by your health care provider. Contact a health care provider if:  You continue to have nausea and vomiting for more than one day.  You develop a rash.  You have trouble urinating Get help right away if:  You have bleeding from the injection site or bleeding under the skin at the injection site.  You have redness, swelling, or pain around your injection site.  You have a fever.  You develop a headache.  You develop new numbness or weakness. This information is not intended to replace advice given to you by your health care provider. Make sure you discuss any questions you have with your health care provider. Document Released: 03/12/2016 Document Revised: 04/26/2016 Document Reviewed: 03/16/2015  2017 Elsevier

## 2016-11-27 NOTE — Progress Notes (Signed)
Dr. Michelle Piperssey notified of patient's chest pain. No further orders given.

## 2016-11-27 NOTE — Transfer of Care (Signed)
Immediate Anesthesia Transfer of Care Note  Patient: Evan Novak  Procedure(s) Performed: Procedure(s) with comments: LEFT FOREARM WOUND EXPLORATION AND NERVE DECOMPRESSION (Left) - mac with block  Patient Location: PACU  Anesthesia Type:MAC  Level of Consciousness: awake  Airway & Oxygen Therapy: Patient Spontanous Breathing and Patient connected to nasal cannula oxygen  Post-op Assessment: Report given to RN and Post -op Vital signs reviewed and stable  Post vital signs: Reviewed and stable  Last Vitals:  Vitals:   11/27/16 1724 11/27/16 1730  BP: 132/67 125/70  Pulse: 62 67  Resp: 11 18  Temp:      Last Pain:  Vitals:   11/27/16 1524  TempSrc: Oral      Patients Stated Pain Goal: 3 (11/27/16 1524)  Complications: No apparent anesthesia complications

## 2016-11-27 NOTE — Progress Notes (Signed)
Patient reported that he has intermittent chest pain for "years". Patient reports the pain is behind his left nipple and has shortness of breath associated with it. Patient reports that it is squeezing in nature and feels like when he was previously diagnosed with PNA. Patient denies radiation of pain. Reports that most recent episode was while he was in preop.

## 2016-11-27 NOTE — Progress Notes (Signed)
Orthopedic Tech Progress Note Patient Details:  Evan Novak 09/13/1986 [id number]  Ortho Devices Type of Ortho Device: Arm sling Ortho Device/Splint Location: LUE Ortho Device/Splint Interventions: Ordered, Application   Jennye MoccasinHughes, [name] Craig 11/27/2016, 7:13 PM

## 2016-11-27 NOTE — Anesthesia Procedure Notes (Deleted)
Anesthesia Procedure Note     

## 2016-11-28 ENCOUNTER — Encounter (HOSPITAL_COMMUNITY): Payer: Self-pay | Admitting: Orthopedic Surgery

## 2016-11-28 NOTE — Op Note (Signed)
NAME:  Judge StallBAKER, Ahlijah                    ACCOUNT NO.:  MEDICAL RECORD NO.:  [id number]  LOCATION:                                 FACILITY:  PHYSICIAN:  Sharma CovertFred W. Atisha Hamidi IV, M.D.DATE OF BIRTH:  03/03/1986  DATE OF PROCEDURE:  11/27/2016 DATE OF DISCHARGE:                              OPERATIVE REPORT   PREOPERATIVE DIAGNOSIS:  Left forearm laceration, possible nerve involvement.  POSTOPERATIVE DIAGNOSIS:  Left forearm laceration, possible nerve involvement.  ATTENDING PHYSICIAN:  Sharma CovertFred W. Jadarious Dobbins, M.D., who scrubbed and present for the entire procedure.  ASSISTANT SURGEON:  None.  ANESTHESIA:  Supraclavicular block with IV sedation.  SURGICAL PROCEDURE:  Left forearm wound exploration and neurolysis, ulnar nerve decompression at the level of the forearm.  SURGICAL INDICATIONS: Mr. Excell SeltzerBaker is a right-hand-dominant gentleman, who sustained a work- related penetrating injury to the forearm directly over the course of the ulnar artery and nerve.  The patient was seen and evaluated in the office and recommended to undergo potential exploration and evaluation to compensate the nerve as well as the underlying FCU tendon and tendons.  Risks, benefits, and alternatives were discussed in detail with the patient and signed informed consent was obtained.  Risks include, but not limited to bleeding; infection; damage to nearby nerves, arteries, or tendons; loss of motion of wrist and digits; incomplete relief of symptoms; and need for further surgical intervention.  DESCRIPTION OF PROCEDURE:  The patient was properly identified in the preoperative holding area, marked with a permanent marker made on the left forearm to indicate the correct operative site.  The patient was brought back to the operating room, placed supine on anesthesia table where IV sedation was administered.  A block had been performed.  A well- padded tourniquet placed on left brachium, sealed with 1000 drape.   Left upper extremity was then prepped and draped in normal sterile fashion. Time-out was called, correct site was identified, and procedure then begun.  Attention was then turned to the left forearm.  The previous incision was then extended proximally and distally.  Limb had been elevated, tourniquet inflated.  Dissection was carried down through the skin and subcutaneous tissue.  The fascial layer had been penetrated and then this was incised longitudinally.  Unhealthy-appearing or the injured fashion was then removed.  The wound was then thoroughly irrigated.  Following this, careful decompression of the ulnar nerve was then carried out.  The ulnar nerve was in continuity as well as the artery.  There did not appear to be any injury to either.  Following this, the FDS and FDP were both inspected, these were in continuity. The median nerve looked good.  The wound was then irrigated after thorough exploration and decompression.  The skin was then closed using horizontal mattress Prolene sutures.  Adaptic dressing, sterile compressive bandage were then applied.  The patient tolerated the procedure well, returned to recovery room in good condition.  POSTPROCEDURAL PLAN:  The patient will be discharged home, seen back in the office in approximately 10 days for wound check, suture removal, and gradual use and activity.     Madelynn DoneFred W. Sharelle Burditt IV, M.D.  FWO/MEDQ  D:  11/27/2016  T:  11/27/2016  Job:  [id number]

## 2017-01-24 ENCOUNTER — Emergency Department (HOSPITAL_COMMUNITY): Payer: Self-pay

## 2017-01-24 ENCOUNTER — Encounter (HOSPITAL_COMMUNITY): Payer: Self-pay

## 2017-01-24 ENCOUNTER — Emergency Department (HOSPITAL_COMMUNITY)
Admission: EM | Admit: 2017-01-24 | Discharge: 2017-01-24 | Disposition: A | Discharge status: Home / Self Care | Payer: Self-pay | Attending: Emergency Medicine | Admitting: Emergency Medicine

## 2017-01-24 DIAGNOSIS — Z79899 Other long term (current) drug therapy: Secondary | ICD-10-CM | POA: Insufficient documentation

## 2017-01-24 DIAGNOSIS — K219 Gastro-esophageal reflux disease without esophagitis: Secondary | ICD-10-CM | POA: Insufficient documentation

## 2017-01-24 DIAGNOSIS — R0789 Other chest pain: Secondary | ICD-10-CM

## 2017-01-24 DIAGNOSIS — F17210 Nicotine dependence, cigarettes, uncomplicated: Secondary | ICD-10-CM | POA: Insufficient documentation

## 2017-01-24 LAB — BASIC METABOLIC PANEL
ANION GAP: 8 (ref 5–15)
BUN: 12 mg/dL (ref 6–20)
CALCIUM: 9.2 mg/dL (ref 8.9–10.3)
CHLORIDE: 107 mmol/L (ref 101–111)
CO2: 24 mmol/L (ref 22–32)
CREATININE: 0.89 mg/dL (ref 0.61–1.24)
GFR calc non Af Amer: >60 mL/min (ref >60)
Glucose, Bld: 104 mg/dL — ABNORMAL HIGH (ref 65–99)
Potassium: 4.1 mmol/L (ref 3.5–5.1)
SODIUM: 139 mmol/L (ref 135–145)

## 2017-01-24 LAB — CBC
HCT: 43.7 % (ref 39.0–52.0)
HEMOGLOBIN: 15.7 g/dL (ref 13.0–17.0)
MCH: 32.1 pg (ref 26.0–34.0)
MCHC: 35.9 g/dL (ref 30.0–36.0)
MCV: 89.4 fL (ref 78.0–100.0)
PLATELETS: 273 10*3/uL (ref 150–400)
RBC: 4.89 MIL/uL (ref 4.22–5.81)
RDW: 12.4 % (ref 11.5–15.5)
WBC: 8.8 10*3/uL (ref 4.0–10.5)

## 2017-01-24 LAB — TROPONIN I

## 2017-01-24 MED ORDER — GI COCKTAIL ~~LOC~~
ORAL | Status: AC
  Filled 2017-01-24: qty 30

## 2017-01-24 MED ORDER — GI COCKTAIL ~~LOC~~
30.0000 mL | Freq: Once | ORAL | Status: AC
  Administered 2017-01-24: 30 mL via ORAL

## 2017-01-24 NOTE — ED Provider Notes (Signed)
[facility] DEPT Provider Note   CSN: [id number] Arrival date & time: 01/24/17  0219  Time seen 04:00 AM   History   Chief Complaint Chief Complaint  Patient presents with  . Chest Pain    HPI Evan Novak is a 31 y.o. male.  HPI  patient was arrested by the police this evening around 12:20 AM. About 1:30 AM he started complaining of a sharp left-sided chest pain. He states it hurts when he breathes deeply, however he pre-shallowly he has no pain. He states he's mildly short of breath. He denies cough, nausea, or vomiting. He states he's had this pain before the last time was about a month ago. He also is complaining of reflux and has some burning in his throat. He states he has it "all the time". He currently is not having chest pain while in the ED. He denies any history of heart hypertension or diabetes. He states a maternal uncle had to have bypass surgery.  PCP none  Past Medical History:  Diagnosis Date  . Anxiety   . GERD (gastroesophageal reflux disease)   . Pneumonia     Patient Active Problem List   Diagnosis Date Noted  . 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 Alcohol abuse 10/10/2016  . Involuntary commitment 10/10/2016    Past Surgical History:  Procedure Laterality Date  . INCISION AND DRAINAGE WOUND WITH TENDON REPAIR Left 11/27/2016   Procedure: LEFT FOREARM WOUND EXPLORATION AND NERVE DECOMPRESSION;  Surgeon: Iran Planas, MD;  Location: [facility];  Service: Orthopedics;  Laterality: Left;  mac with block  . NO PAST SURGERIES         Home Medications    Prior to Admission medications   Medication Sig Start Date End Date Taking? Authorizing Provider  docusate sodium (COLACE) 100 MG capsule Take 1 capsule (100 mg total) by mouth 2 (two) times daily. 11/27/16   Iran Planas, MD  HYDROcodone-acetaminophen (NORCO) 10-325 MG tablet Take 1 tablet by mouth every 6 (six) hours as needed. 11/27/16   Iran Planas, MD  vitamin C (ASCORBIC ACID)  500 MG tablet Take 1 tablet (500 mg total) by mouth daily. 11/27/16   Iran Planas, MD    Family History Family History  Problem Relation Age of Onset  . Peripheral vascular disease Mother     Social History Social History  Substance Use Topics  . Smoking status: Current Every Day Smoker    Packs/day: 1.00    Types: Cigarettes  . Smokeless tobacco: Never Used     Comment: down to 7-8 cigarettes  . Alcohol use Yes     Comment: socially  on workman's comp for a stabbing in his L forearm, just returned to work in past week Smokes 1/2 ppd   Allergies   Penicillins   Review of Systems Review of Systems  All other systems reviewed and are negative.    Physical Exam Updated Vital Signs BP 109/69   Pulse 85   Temp 98.4 F (36.9 C) (Oral)   Resp 17   Ht _0  (1.727 m)   Wt 150 lb (68 kg)   SpO2 97%   BMI 22.81 kg/m   Vital signs normal    Physical Exam  Constitutional: He is oriented to person, place, and time. He appears well-developed and well-nourished.  Non-toxic appearance. He does not appear ill. No distress.  HENT:  Head: Normocephalic and atraumatic.  Right Ear: External ear normal.  Left Ear: External ear normal.  Nose: Nose normal. No mucosal edema or rhinorrhea.  Mouth/Throat: Oropharynx is clear and moist and mucous membranes are normal. No dental abscesses or uvula swelling.  Eyes: Conjunctivae and EOM are normal. Pupils are equal, round, and reactive to light.  Neck: Normal range of motion and full passive range of motion without pain. Neck supple.  Cardiovascular: Normal rate, regular rhythm and normal heart sounds.  Exam reveals no gallop and no friction rub.   No murmur heard. Pulmonary/Chest: Effort normal and breath sounds normal. No respiratory distress. He has no wheezes. He has no rhonchi. He has no rales. He exhibits no tenderness and no crepitus.    Patient is tender near the left costochondral junction inferiorly.  Abdominal: Soft.  Normal appearance and bowel sounds are normal. He exhibits no distension. There is no tenderness. There is no rebound and no guarding.  Musculoskeletal: Normal range of motion. He exhibits no edema or tenderness.  Moves all extremities well.   Neurological: He is alert and oriented to person, place, and time. He has normal strength. No cranial nerve deficit.  Skin: Skin is warm, dry and intact. No rash noted. No erythema. No pallor.  Psychiatric: He has a normal mood and affect. His speech is normal and behavior is normal. His mood appears not anxious.  Nursing note and vitals reviewed.    ED Treatments / Results  Labs (all labs ordered are listed, but only abnormal results are displayed) Results for orders placed or performed during the hospital encounter of 42/68/34  Basic metabolic panel  Result Value Ref Range   Sodium 139 135 - 145 mmol/L   Potassium 4.1 3.5 - 5.1 mmol/L   Chloride 107 101 - 111 mmol/L   CO2 24 22 - 32 mmol/L   Glucose, Bld 104 (H) 65 - 99 mg/dL   BUN 12 6 - 20 mg/dL   Creatinine, Ser 0.89 0.61 - 1.24 mg/dL   Calcium 9.2 8.9 - 10.3 mg/dL   GFR calc non Af Amer >60 >60 mL/min   GFR calc Af Amer >60 >60 mL/min   Anion gap 8 5 - 15  CBC  Result Value Ref Range   WBC 8.8 4.0 - 10.5 K/uL   RBC 4.89 4.22 - 5.81 MIL/uL   Hemoglobin 15.7 13.0 - 17.0 g/dL   HCT 43.7 39.0 - 52.0 %   MCV 89.4 78.0 - 100.0 fL   MCH 32.1 26.0 - 34.0 pg   MCHC 35.9 30.0 - 36.0 g/dL   RDW 12.4 11.5 - 15.5 %   Platelets 273 150 - 400 K/uL  Troponin I  Result Value Ref Range   Troponin I <0.03 <0.03 ng/mL   Laboratory interpretation all normal     EKG  EKG Interpretation  Date/Time:  Thursday January 24 2017 02:29:30 EST Ventricular Rate:  85 PR Interval:    QRS Duration: 103 QT Interval:  386 QTC Calculation: 459 R Axis:   54 Text Interpretation:  Sinus rhythm RSR' in V1 or V2, right VCD or RVH early repolarization No significant change since last tracing 27 Nov 2016  Confirmed by Elio Haden  MD-I, Marillyn Goren ([id number]) on 01/24/2017 2:37:53 AM       Radiology Dg Chest 2 View  Result Date: 01/24/2017 CLINICAL DATA:  Chest pain and shortness of breath EXAM: CHEST  2 VIEW COMPARISON:  None. FINDINGS: The heart size and mediastinal contours are within normal limits. Both lungs are clear. The visualized skeletal structures are unremarkable. IMPRESSION: No active cardiopulmonary disease.  Electronically Signed   By: Donavan Foil M.D.   On: 01/24/2017 02:55    Procedures Procedures (including critical care time)  Medications Ordered in ED Medications  gi cocktail suspension (not administered)  gi cocktail (Maalox,Lidocaine,Donnatal) (30 mLs Oral Given 01/24/17 0427)     Initial Impression / Assessment and Plan / ED Course  I have reviewed the triage vital signs and the nursing notes.  Pertinent labs & imaging results that were available during my care of the patient were reviewed by me and considered in my medical decision making (see chart for details).  Patient's chest pain was gone when I saw him in the ED. He was advised he could take Motrin for this pain and use ice and heat for comfort. He also is complaining of some reflux symptoms and he was given a GI cocktail. Patient was released with the police.  Final Clinical Impressions(s) / ED Diagnoses   Final diagnoses:  Chest wall pain  Gastroesophageal reflux disease without esophagitis    New Prescriptions Discharge Medication List as of 01/24/2017  4:18 AM    OTC ibuprofen  Plan discharge  Rolland Porter, MD, Barbette Or, MD 01/24/17 [phone]

## 2017-01-24 NOTE — Discharge Instructions (Signed)
Use ice and heat for comfort. You can take ibuprofen 600 mg 4 times a day for pain as needed. Recheck if you get a fever, struggle to breathe or seem worse.

## 2017-01-24 NOTE — ED Triage Notes (Signed)
Pt was arrested tonight for dwi and when he got to the jail states he started having chest pain and anxiety.  Pt states he has anxiety attacks previously

## 2020-06-30 ENCOUNTER — Other Ambulatory Visit: Payer: Self-pay

## 2020-06-30 ENCOUNTER — Encounter: Payer: Self-pay | Admitting: Emergency Medicine

## 2020-06-30 ENCOUNTER — Emergency Department: Payer: Self-pay

## 2020-06-30 ENCOUNTER — Emergency Department
Admission: EM | Admit: 2020-06-30 | Discharge: 2020-06-30 | Disposition: A | Discharge status: Home / Self Care | Payer: Self-pay | Attending: Emergency Medicine | Admitting: Emergency Medicine

## 2020-06-30 DIAGNOSIS — E860 Dehydration: Secondary | ICD-10-CM

## 2020-06-30 DIAGNOSIS — R42 Dizziness and giddiness: Secondary | ICD-10-CM | POA: Insufficient documentation

## 2020-06-30 DIAGNOSIS — F17210 Nicotine dependence, cigarettes, uncomplicated: Secondary | ICD-10-CM | POA: Insufficient documentation

## 2020-06-30 DIAGNOSIS — R002 Palpitations: Secondary | ICD-10-CM | POA: Insufficient documentation

## 2020-06-30 LAB — CBC
HCT: 42.1 % (ref 39.0–52.0)
Hemoglobin: 14.7 g/dL (ref 13.0–17.0)
MCH: 30.9 pg (ref 26.0–34.0)
MCHC: 34.9 g/dL (ref 30.0–36.0)
MCV: 88.4 fL (ref 80.0–100.0)
Platelets: 262 10*3/uL (ref 150–400)
RBC: 4.76 MIL/uL (ref 4.22–5.81)
RDW: 12.1 % (ref 11.5–15.5)
WBC: 6.8 10*3/uL (ref 4.0–10.5)
nRBC: 0 % (ref 0.0–0.2)

## 2020-06-30 LAB — BASIC METABOLIC PANEL
Anion gap: 9 (ref 5–15)
BUN: 13 mg/dL (ref 6–20)
CO2: 26 mmol/L (ref 22–32)
Calcium: 9 mg/dL (ref 8.9–10.3)
Chloride: 103 mmol/L (ref 98–111)
Creatinine, Ser: 0.99 mg/dL (ref 0.61–1.24)
GFR calc Af Amer: >60 mL/min (ref >60)
GFR calc non Af Amer: >60 mL/min (ref >60)
Glucose, Bld: 92 mg/dL (ref 70–99)
Potassium: 3.5 mmol/L (ref 3.5–5.1)
Sodium: 138 mmol/L (ref 135–145)

## 2020-06-30 LAB — TROPONIN I (HIGH SENSITIVITY): Troponin I (High Sensitivity): 4 ng/L (ref <18)

## 2020-06-30 MED ORDER — SODIUM CHLORIDE 0.9% FLUSH
3.0000 mL | Freq: Once | INTRAVENOUS | Status: AC
  Administered 2020-06-30: 09:00:00 3 mL via INTRAVENOUS

## 2020-06-30 MED ORDER — LACTATED RINGERS IV BOLUS
1000.0000 mL | Freq: Once | INTRAVENOUS | Status: AC
Start: 2020-06-30 — End: 2020-06-30
  Administered 2020-06-30: 09:00:00 1000 mL via INTRAVENOUS

## 2020-06-30 NOTE — ED Provider Notes (Signed)
[facility] Emergency Department Provider Note  ____________________________________________   First MD Initiated Contact with Patient 06/30/20 [phone]     (approximate)  I have reviewed the triage vital signs and the nursing notes.   HISTORY  Chief Complaint Palpitations    HPI Evan Novak is a 34 y.o. male  Here with lightheadedness, palpitations. Pt states that yesterday, he went to work. He works in a [place] with frequent bending/lifting and physical activity. He states that while at work, he began to feel flushed, lightheaded, and shaky with palpitations/sensatoin that his heart was beating quickly. He was standing at the time. He sat down to rest which mildly improved his sx but they then returned shortly after. He went home and was tired all day, but tried to go back to work today. He reports that once he started working again, his sx returned and he had ongoing, recurrent feeling of lightheadedness, flushing, and palpitations. No overt chest pain, SOB. No h/o similar issues. He does admit to working and being outside "a lot" recently, in the heat. Denies any known personal or family cardiac history. No current sx.        Past Medical History:  Diagnosis Date  . Anxiety   . GERD (gastroesophageal reflux disease)   . Pneumonia     Patient Active Problem List   Diagnosis Date Noted  . 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 Alcohol abuse 10/10/2016  . Involuntary commitment 10/10/2016    Past Surgical History:  Procedure Laterality Date  . INCISION AND DRAINAGE WOUND WITH TENDON REPAIR Left 11/27/2016   Procedure: LEFT FOREARM WOUND EXPLORATION AND NERVE DECOMPRESSION;  Surgeon: Iran Planas, MD;  Location: [facility];  Service: Orthopedics;  Laterality: Left;  mac with block  . NO PAST SURGERIES      Prior to Admission medications   Medication Sig Start Date End Date Taking? Authorizing Provider  docusate sodium (COLACE) 100 MG  capsule Take 1 capsule (100 mg total) by mouth 2 (two) times daily. 11/27/16   Iran Planas, MD  HYDROcodone-acetaminophen (NORCO) 10-325 MG tablet Take 1 tablet by mouth every 6 (six) hours as needed. 11/27/16   Iran Planas, MD  vitamin C (ASCORBIC ACID) 500 MG tablet Take 1 tablet (500 mg total) by mouth daily. 11/27/16   Iran Planas, MD    Allergies Penicillins  Family History  Problem Relation Age of Onset  . Peripheral vascular disease Mother     Social History Social History   Tobacco Use  . Smoking status: Current Every Day Smoker    Packs/day: 1.00    Types: Cigarettes  . Smokeless tobacco: Never Used  . Tobacco comment: down to 7-8 cigarettes  Substance Use Topics  . Alcohol use: Yes    Comment: socially  . Drug use: No    Review of Systems  Review of Systems  Constitutional: Positive for fatigue. Negative for chills and fever.  HENT: Negative for sore throat.   Respiratory: Negative for shortness of breath.   Cardiovascular: Positive for palpitations. Negative for chest pain.  Gastrointestinal: Negative for abdominal pain.  Genitourinary: Negative for flank pain.  Musculoskeletal: Negative for neck pain.  Skin: Negative for rash and wound.  Allergic/Immunologic: Negative for immunocompromised state.  Neurological: Positive for weakness and light-headedness. Negative for numbness.  Hematological: Does not bruise/bleed easily.  All other systems reviewed and are negative.    ____________________________________________  PHYSICAL EXAM:      VITAL SIGNS: ED Triage Vitals  Enc Vitals Group     BP 06/30/20 0814 (!) 123/89     Pulse Rate 06/30/20 0814 72     Resp 06/30/20 0814 20     Temp 06/30/20 0814 98.5 F (36.9 C)     Temp Source 06/30/20 0814 Oral     SpO2 06/30/20 0814 99 %     Weight 06/30/20 0815 191 lb (86.6 kg)     Height 06/30/20 0815 _0  (1.753 m)     Head Circumference --      Peak Flow --      Pain Score 06/30/20 0815 0     Pain  Loc --      Pain Edu? --      Excl. in [address]? --      Physical Exam Vitals and nursing note reviewed.  Constitutional:      General: He is not in acute distress.    Appearance: He is well-developed.  HENT:     Head: Normocephalic and atraumatic.  Eyes:     Conjunctiva/sclera: Conjunctivae normal.  Cardiovascular:     Rate and Rhythm: Normal rate and regular rhythm.     Heart sounds: Normal heart sounds.  Pulmonary:     Effort: Pulmonary effort is normal. No respiratory distress.     Breath sounds: No wheezing.  Abdominal:     General: There is no distension.  Musculoskeletal:     Cervical back: Neck supple.  Skin:    General: Skin is warm.     Capillary Refill: Capillary refill takes less than 2 seconds.     Findings: No rash.  Neurological:     Mental Status: He is alert and oriented to person, place, and time.     Motor: No abnormal muscle tone.       ____________________________________________   LABS (all labs ordered are listed, but only abnormal results are displayed)  Labs Reviewed  BASIC METABOLIC PANEL  CBC  TROPONIN I (HIGH SENSITIVITY)  TROPONIN I (HIGH SENSITIVITY)    ____________________________________________  EKG: Normal sinus rhythm, VR 74. PR 176, QRS 102, QTc 430. No acute St elevations or depressions. Incomplete RBBB. Borderline EKG.  ________________________________________  RADIOLOGY All imaging, including plain films, CT scans, and ultrasounds, independently reviewed by me, and interpretations confirmed via formal radiology reads.  ED MD interpretation:   CXR: Clear, no focal abnormality, no cardiomegaly  Official radiology report(s): DG Chest 2 View  Result Date: 06/30/2020 CLINICAL DATA:  Chest pain, palpitation EXAM: CHEST - 2 VIEW COMPARISON:  January 24, 2017 FINDINGS: Cardiomediastinal contours and hilar structures are normal. Trachea is midline. Lungs are clear. No sign of effusion. On limited assessment visualized skeletal  structures are unremarkable. IMPRESSION: Normal chest. Electronically Signed   By: Zetta Bills M.D.   On: 06/30/2020 08:38    ____________________________________________  PROCEDURES   Procedure(s) performed (including Critical Care):  Procedures  ____________________________________________  INITIAL IMPRESSION / MDM / [place] / ED COURSE  As part of my medical decision making, I reviewed the following data within the [facility] notes reviewed and incorporated, Old chart reviewed, Notes from prior ED visits, and [place] Controlled Substance Database       *DYSHAWN CANGELOSI was evaluated in Emergency Department on 06/30/2020 for the symptoms described in the history of present illness. He was evaluated in the context of the global COVID-19 pandemic, which necessitated consideration that the patient might be at risk for infection with the SARS-CoV-2 virus that  causes COVID-19. Institutional protocols and algorithms that pertain to the evaluation of patients at risk for COVID-19 are in a state of rapid change based on information released by regulatory bodies including the CDC and federal and state organizations. These policies and algorithms were followed during the patient's care in the ED.  Some ED evaluations and interventions may be delayed as a result of limited staffing during the pandemic.*  Clinical Course as of Jun 30 1013  Thu Jun 30, 2020  0938 34 yo M here with suspected transient orthostatic sx vs vasovagal reaction to bending/twisting at work. Sx now resolved. EKG is normal and pt has no signs of ischemia, infarct, or arrhythmia. Tele is wnl. CXR clear with no abnormalities. Trop neg despite sx >6 hr, doubt ACS. No anemia or other abnormalities on labs. Pt did have HR increase on lying-sitting transition w/ reproduction of sx. Will give fluids, ambulate, reassess. No apparent emergent pathology.   [CI]    Clinical Course User Index [CI]  Duffy Bruce, MD    Medical Decision Making:  As above. No apparent emergent pathology. D/c home.  ____________________________________________  FINAL CLINICAL IMPRESSION(S) / ED DIAGNOSES  Final diagnoses:  None     MEDICATIONS GIVEN DURING THIS VISIT:  Medications  sodium chloride flush (NS) 0.9 % injection 3 mL (3 mLs Intravenous Given 06/30/20 0856)  lactated ringers bolus 1,000 mL (1,000 mLs Intravenous New Bag/Given 06/30/20 2671)     ED Discharge Orders    None       Note:  This document was prepared using Dragon voice recognition software and may include unintentional dictation errors.   Duffy Bruce, MD 06/30/20 1100

## 2020-06-30 NOTE — ED Notes (Signed)
Assumed care of patient reports feeling dizzy yesterday, left work early and went to bed, woke up today feeling the same with some cp aswell. Labs drawn and sent. Placed on cardiac monitor. Awaiting md eval.

## 2020-06-30 NOTE — ED Triage Notes (Signed)
Pt presents to ED via POV with c/o palpitations that started yesterday. Pt also c/o feeling hot and diaphoresis. Pt states woke up this morning and continued to have palpitations.

## 2020-06-30 NOTE — Discharge Instructions (Addendum)
Drink at least 8 glasses of water over the next 24 hours  Be cautious at work and if you develop similar symptoms, sit or lie down and drink a glass of cold water  Avoid being out in the heat for the next several days

## 2020-11-17 ENCOUNTER — Other Ambulatory Visit: Payer: Self-pay

## 2020-11-17 DIAGNOSIS — L02212 Cutaneous abscess of back [any part, except buttock]: Secondary | ICD-10-CM | POA: Insufficient documentation

## 2020-11-17 DIAGNOSIS — M549 Dorsalgia, unspecified: Secondary | ICD-10-CM | POA: Insufficient documentation

## 2020-11-17 DIAGNOSIS — Z5321 Procedure and treatment not carried out due to patient leaving prior to being seen by health care provider: Secondary | ICD-10-CM | POA: Insufficient documentation

## 2020-11-17 LAB — CBC WITH DIFFERENTIAL/PLATELET
Abs Immature Granulocytes: 0.05 10*3/uL (ref 0.00–0.07)
Basophils Absolute: 0.1 10*3/uL (ref 0.0–0.1)
Basophils Relative: 1 %
Eosinophils Absolute: 0.4 10*3/uL (ref 0.0–0.5)
Eosinophils Relative: 3 %
HCT: 41.9 % (ref 39.0–52.0)
Hemoglobin: 14.7 g/dL (ref 13.0–17.0)
Immature Granulocytes: 0 %
Lymphocytes Relative: 27 %
Lymphs Abs: 3.4 10*3/uL (ref 0.7–4.0)
MCH: 31.5 pg (ref 26.0–34.0)
MCHC: 35.1 g/dL (ref 30.0–36.0)
MCV: 89.9 fL (ref 80.0–100.0)
Monocytes Absolute: 1.3 10*3/uL — ABNORMAL HIGH (ref 0.1–1.0)
Monocytes Relative: 10 %
Neutro Abs: 7.8 10*3/uL — ABNORMAL HIGH (ref 1.7–7.7)
Neutrophils Relative %: 59 %
Platelets: 324 10*3/uL (ref 150–400)
RBC: 4.66 MIL/uL (ref 4.22–5.81)
RDW: 11.9 % (ref 11.5–15.5)
WBC: 12.9 10*3/uL — ABNORMAL HIGH (ref 4.0–10.5)
nRBC: 0 % (ref 0.0–0.2)

## 2020-11-17 NOTE — ED Triage Notes (Signed)
PT to ED via POV c/o back pain d/t abcess. Mild redness surrounding area. Has been there a month but got very painful tonight.

## 2020-11-18 ENCOUNTER — Emergency Department
Admission: EM | Admit: 2020-11-18 | Discharge: 2020-11-18 | Disposition: A | Discharge status: Home / Self Care | Payer: Self-pay | Attending: Emergency Medicine | Admitting: Emergency Medicine

## 2020-11-18 LAB — COMPREHENSIVE METABOLIC PANEL
ALT: 24 U/L (ref 0–44)
AST: 20 U/L (ref 15–41)
Albumin: 4.2 g/dL (ref 3.5–5.0)
Alkaline Phosphatase: 65 U/L (ref 38–126)
Anion gap: 11 (ref 5–15)
BUN: 15 mg/dL (ref 6–20)
CO2: 26 mmol/L (ref 22–32)
Calcium: 9.1 mg/dL (ref 8.9–10.3)
Chloride: 102 mmol/L (ref 98–111)
Creatinine, Ser: 0.89 mg/dL (ref 0.61–1.24)
GFR, Estimated: >60 mL/min (ref >60)
Glucose, Bld: 88 mg/dL (ref 70–99)
Potassium: 3.9 mmol/L (ref 3.5–5.1)
Sodium: 139 mmol/L (ref 135–145)
Total Bilirubin: 1.1 mg/dL (ref 0.3–1.2)
Total Protein: 7.5 g/dL (ref 6.5–8.1)

## 2020-11-18 LAB — LACTIC ACID, PLASMA: Lactic Acid, Venous: 1.3 mmol/L (ref 0.5–1.9)

## 2020-12-01 IMAGING — CR DG CHEST 2V
1 series · 2 of 2 positions shown · non-contrast
Comparison: January 24, 2017

CLINICAL DATA: Chest pain, palpitation

EXAM:
CHEST - 2 VIEW

[Series 1: dg chest 2 view · 0.14mm/px · 2 of 2 slices shown]
[im 1/2]
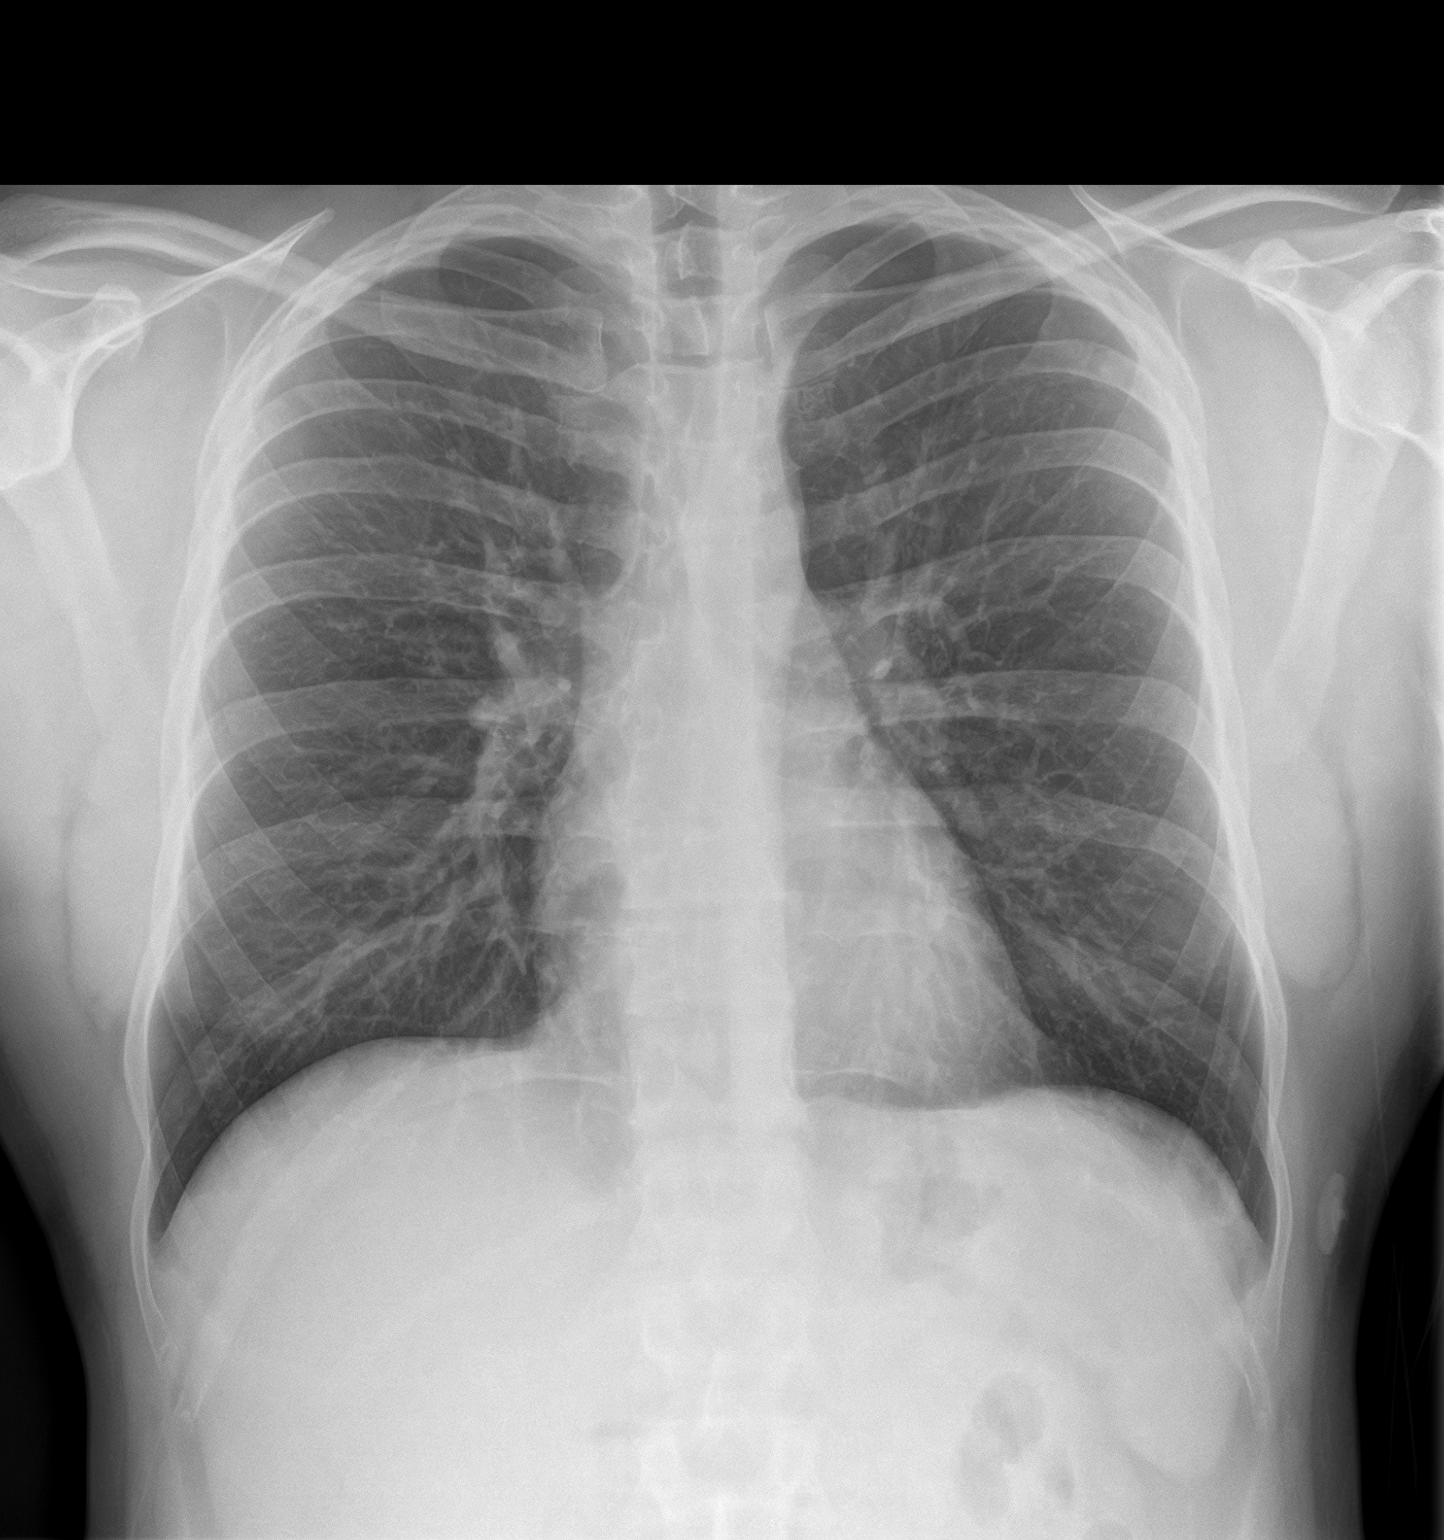
[im 2/2]
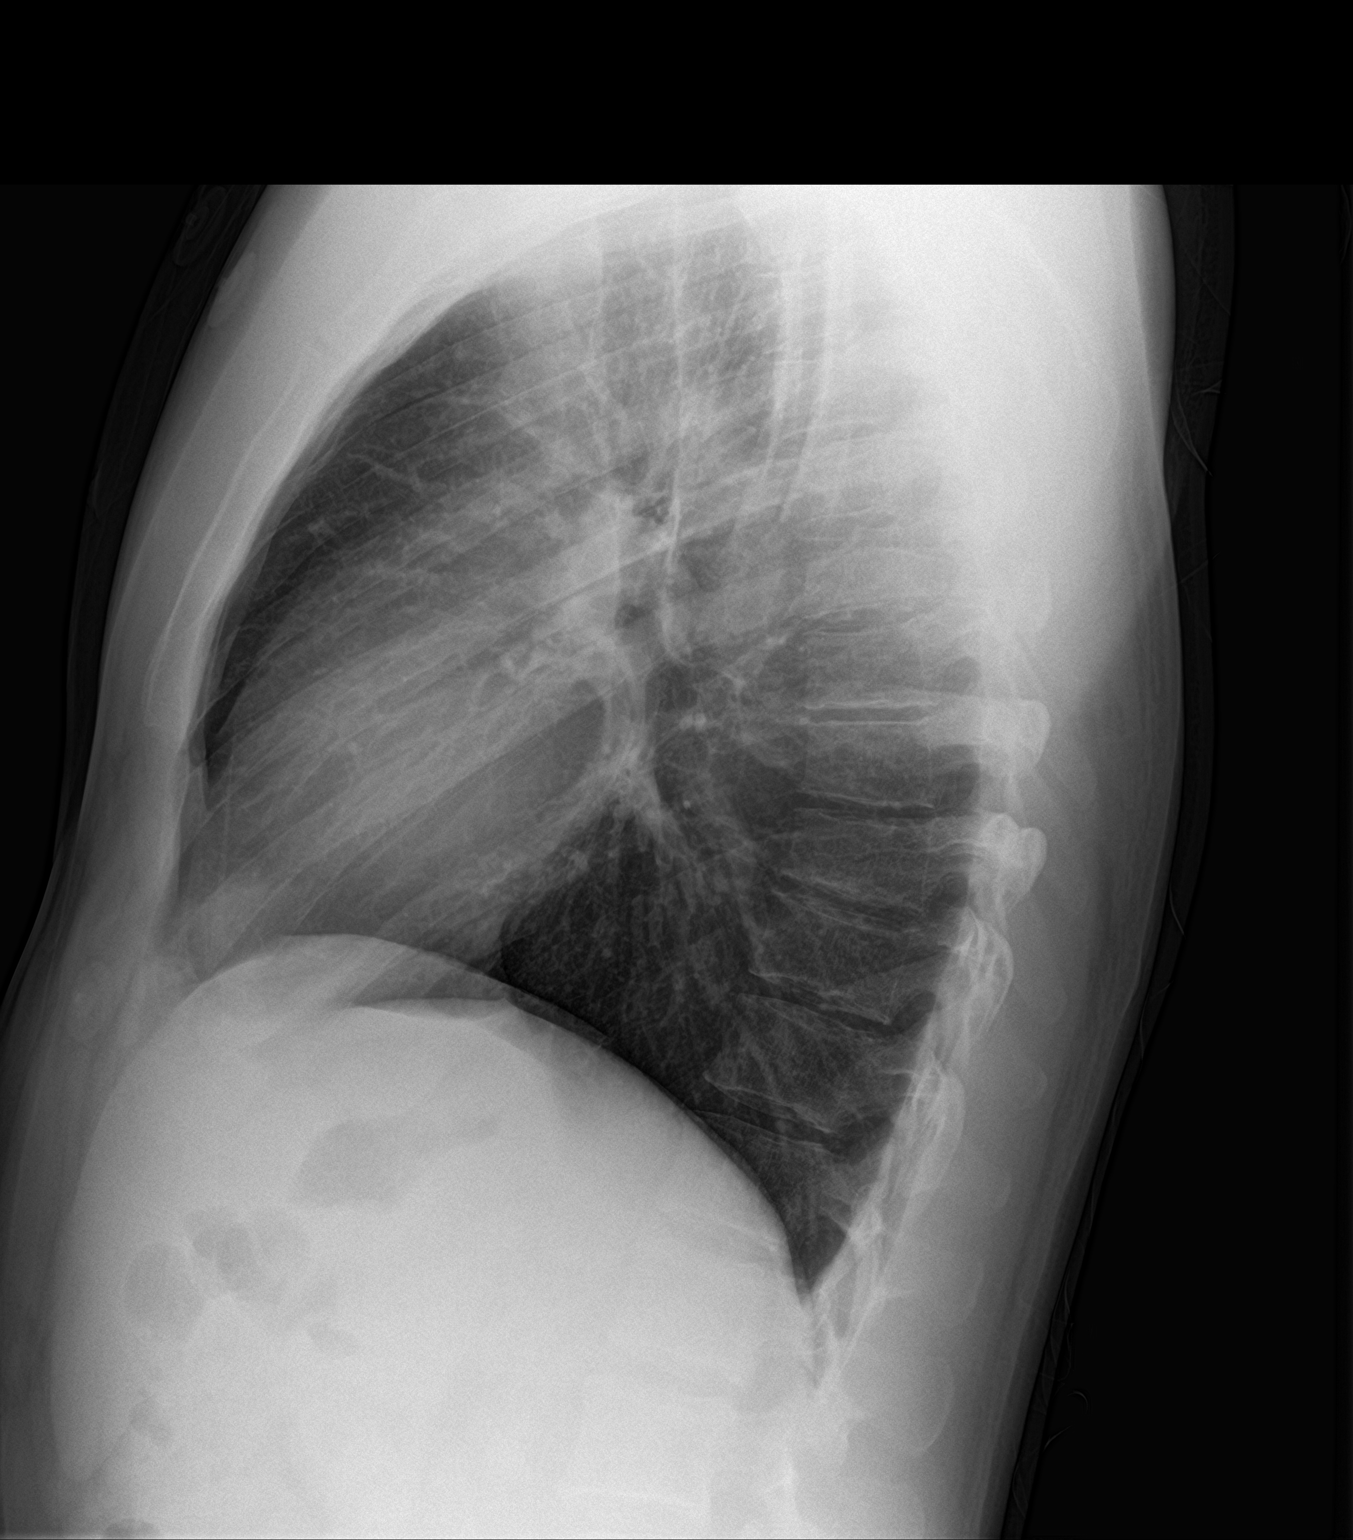

[2 of 2 positions shown; findings below may reference images not displayed]

FINDINGS: Cardiomediastinal contours and hilar structures are normal. Trachea
is midline. Lungs are clear. No sign of effusion. On limited
assessment visualized skeletal structures are unremarkable.
IMPRESSION: Normal chest.

## 2023-09-22 ENCOUNTER — Emergency Department: Payer: Self-pay

## 2023-09-22 ENCOUNTER — Other Ambulatory Visit: Payer: Self-pay

## 2023-09-22 ENCOUNTER — Emergency Department
Admission: EM | Admit: 2023-09-22 | Discharge: 2023-09-22 | Discharge status: Against Medical Advice | Payer: Self-pay | Attending: Student | Admitting: Student

## 2023-09-22 DIAGNOSIS — R531 Weakness: Secondary | ICD-10-CM | POA: Insufficient documentation

## 2023-09-22 DIAGNOSIS — H538 Other visual disturbances: Secondary | ICD-10-CM | POA: Insufficient documentation

## 2023-09-22 DIAGNOSIS — R519 Headache, unspecified: Secondary | ICD-10-CM | POA: Insufficient documentation

## 2023-09-22 DIAGNOSIS — Z5321 Procedure and treatment not carried out due to patient leaving prior to being seen by health care provider: Secondary | ICD-10-CM | POA: Insufficient documentation

## 2023-09-22 DIAGNOSIS — R42 Dizziness and giddiness: Secondary | ICD-10-CM | POA: Insufficient documentation

## 2023-09-22 LAB — COMPREHENSIVE METABOLIC PANEL
ALT: 36 U/L (ref 0–44)
AST: 23 U/L (ref 15–41)
Albumin: 4.8 g/dL (ref 3.5–5.0)
Alkaline Phosphatase: 65 U/L (ref 38–126)
Anion gap: 9 (ref 5–15)
BUN: 12 mg/dL (ref 6–20)
CO2: 25 mmol/L (ref 22–32)
Calcium: 9.8 mg/dL (ref 8.9–10.3)
Chloride: 103 mmol/L (ref 98–111)
Creatinine, Ser: 0.94 mg/dL (ref 0.61–1.24)
GFR, Estimated: >60 mL/min (ref >60)
Glucose, Bld: 105 mg/dL — ABNORMAL HIGH (ref 70–99)
Potassium: 4 mmol/L (ref 3.5–5.1)
Sodium: 137 mmol/L (ref 135–145)
Total Bilirubin: 0.9 mg/dL (ref 0.3–1.2)
Total Protein: 8.1 g/dL (ref 6.5–8.1)

## 2023-09-22 LAB — CBC WITH DIFFERENTIAL/PLATELET
Abs Immature Granulocytes: 0.04 10*3/uL (ref 0.00–0.07)
Basophils Absolute: 0 10*3/uL (ref 0.0–0.1)
Basophils Relative: 0 %
Eosinophils Absolute: 0.1 10*3/uL (ref 0.0–0.5)
Eosinophils Relative: 1 %
HCT: 46.6 % (ref 39.0–52.0)
Hemoglobin: 16.5 g/dL (ref 13.0–17.0)
Immature Granulocytes: 0 %
Lymphocytes Relative: 12 %
Lymphs Abs: 1.1 10*3/uL (ref 0.7–4.0)
MCH: 31.1 pg (ref 26.0–34.0)
MCHC: 35.4 g/dL (ref 30.0–36.0)
MCV: 87.9 fL (ref 80.0–100.0)
Monocytes Absolute: 0.8 10*3/uL (ref 0.1–1.0)
Monocytes Relative: 8 %
Neutro Abs: 7.5 10*3/uL (ref 1.7–7.7)
Neutrophils Relative %: 79 %
Platelets: 271 10*3/uL (ref 150–400)
RBC: 5.3 MIL/uL (ref 4.22–5.81)
RDW: 11.7 % (ref 11.5–15.5)
WBC: 9.6 10*3/uL (ref 4.0–10.5)
nRBC: 0 % (ref 0.0–0.2)

## 2023-09-22 LAB — TROPONIN I (HIGH SENSITIVITY): Troponin I (High Sensitivity): 4 ng/L (ref <18)

## 2023-09-22 NOTE — ED Triage Notes (Signed)
Pt in via POV stating that he laid down this evening and his head felt like it was "getting hotter and hotter." Pt states when he tried to get up his legs felt weak and like he was going to pass out. Pt states he feels SOB.

## 2023-09-22 NOTE — ED Provider Triage Note (Signed)
Emergency Medicine Provider Triage Evaluation Note  STELLAN FERMAN , a 37 y.o. male  was evaluated in triage.  Pt complains of dizziness. When he tried to get up he felt like his legs didn't want to work. Denies any history of similar symptoms. Describes the dizziness as room spinning and like he would pass out.  No recent illness or fevers.  Review of Systems  Positive: Dizziness, vision changes, headache Negative: Nausea, cp, hearing changes  Physical Exam  There were no vitals taken for this visit. Gen:   Awake, no distress   Resp:  Normal effort  MSK:   Moves extremities without difficulty  Other:    Medical Decision Making  Medically screening exam initiated at 6:33 PM.  Appropriate orders placed.  Mingo Amber was informed that the remainder of the evaluation will be completed by another provider, this initial triage assessment does not replace that evaluation, and the importance of remaining in the ED until their evaluation is complete.    Cameron Ali, PA-C 09/22/23 [id number]

## 2024-05-03 ENCOUNTER — Emergency Department
Admission: EM | Admit: 2024-05-03 | Discharge: 2024-05-03 | Discharge status: Against Medical Advice | Payer: Self-pay | Attending: Emergency Medicine | Admitting: Emergency Medicine

## 2024-05-03 DIAGNOSIS — Z5321 Procedure and treatment not carried out due to patient leaving prior to being seen by health care provider: Secondary | ICD-10-CM | POA: Insufficient documentation

## 2024-05-03 DIAGNOSIS — Z0289 Encounter for other administrative examinations: Secondary | ICD-10-CM | POA: Insufficient documentation

## 2024-05-03 NOTE — ED Triage Notes (Signed)
 Pt to ED with [employer] under arrest. Pt refusing to be seen by doctor at this time and supervisor has viewed warrant for arrest. A&O x4, even respirations in triage.

## 2024-05-03 NOTE — ED Notes (Signed)
 Pt refused to be evaluated by an MD. Ambulated with officers independently out of department.
